# Patient Record
Sex: Female | Born: 1960 | Race: White | Hispanic: No | Marital: Married | Smoking: Never smoker
Health system: Southern US, Community
[De-identification: ages and names within clinical notes are randomized; demographics above are authoritative.]

## PROBLEM LIST (undated history)

## (undated) DIAGNOSIS — H5310 Unspecified subjective visual disturbances: Secondary | ICD-10-CM

## (undated) DIAGNOSIS — H518 Other specified disorders of binocular movement: Secondary | ICD-10-CM

## (undated) DIAGNOSIS — I10 Essential (primary) hypertension: Secondary | ICD-10-CM

## (undated) DIAGNOSIS — F32A Depression, unspecified: Secondary | ICD-10-CM

## (undated) DIAGNOSIS — Z803 Family history of malignant neoplasm of breast: Secondary | ICD-10-CM

## (undated) DIAGNOSIS — F419 Anxiety disorder, unspecified: Secondary | ICD-10-CM

## (undated) DIAGNOSIS — H5015 Alternating exotropia: Secondary | ICD-10-CM

## (undated) HISTORY — PX: SUPRACERVICAL ABDOMINAL HYSTERECTOMY: SHX5393

---

## 2003-03-08 ENCOUNTER — Ambulatory Visit (HOSPITAL_COMMUNITY): Admission: RE | Admit: 2003-03-08 | Discharge: 2003-03-08 | Payer: Self-pay | Admitting: Obstetrics and Gynecology

## 2004-10-19 ENCOUNTER — Other Ambulatory Visit: Admission: RE | Admit: 2004-10-19 | Discharge: 2004-10-19 | Payer: Self-pay | Admitting: Obstetrics and Gynecology

## 2005-12-20 ENCOUNTER — Ambulatory Visit (HOSPITAL_COMMUNITY): Admission: RE | Admit: 2005-12-20 | Discharge: 2005-12-21 | Payer: Self-pay | Admitting: Obstetrics and Gynecology

## 2012-04-11 DIAGNOSIS — H5034 Intermittent alternating exotropia: Secondary | ICD-10-CM | POA: Insufficient documentation

## 2012-11-08 ENCOUNTER — Encounter (INDEPENDENT_AMBULATORY_CARE_PROVIDER_SITE_OTHER): Payer: Self-pay

## 2012-11-08 DIAGNOSIS — I83893 Varicose veins of bilateral lower extremities with other complications: Secondary | ICD-10-CM

## 2014-09-17 DIAGNOSIS — Z9889 Other specified postprocedural states: Secondary | ICD-10-CM | POA: Insufficient documentation

## 2014-10-29 ENCOUNTER — Other Ambulatory Visit: Payer: Self-pay | Admitting: Obstetrics and Gynecology

## 2014-10-31 LAB — CYTOLOGY - PAP

## 2015-09-03 DIAGNOSIS — H5015 Alternating exotropia: Secondary | ICD-10-CM | POA: Diagnosis not present

## 2015-10-13 DIAGNOSIS — H5310 Unspecified subjective visual disturbances: Secondary | ICD-10-CM | POA: Diagnosis not present

## 2015-10-13 DIAGNOSIS — H518 Other specified disorders of binocular movement: Secondary | ICD-10-CM | POA: Diagnosis not present

## 2015-10-13 DIAGNOSIS — H5015 Alternating exotropia: Secondary | ICD-10-CM | POA: Diagnosis not present

## 2015-11-04 DIAGNOSIS — Z01419 Encounter for gynecological examination (general) (routine) without abnormal findings: Secondary | ICD-10-CM | POA: Diagnosis not present

## 2015-11-04 DIAGNOSIS — Z1231 Encounter for screening mammogram for malignant neoplasm of breast: Secondary | ICD-10-CM | POA: Diagnosis not present

## 2015-11-04 DIAGNOSIS — Z13820 Encounter for screening for osteoporosis: Secondary | ICD-10-CM | POA: Diagnosis not present

## 2015-11-04 DIAGNOSIS — Z6824 Body mass index (BMI) 24.0-24.9, adult: Secondary | ICD-10-CM | POA: Diagnosis not present

## 2015-11-17 DIAGNOSIS — H5015 Alternating exotropia: Secondary | ICD-10-CM | POA: Diagnosis not present

## 2015-11-17 DIAGNOSIS — H518 Other specified disorders of binocular movement: Secondary | ICD-10-CM | POA: Diagnosis not present

## 2015-11-17 HISTORY — PX: STRABISMUS SURGERY: SHX218

## 2016-04-13 DIAGNOSIS — J0110 Acute frontal sinusitis, unspecified: Secondary | ICD-10-CM | POA: Diagnosis not present

## 2016-05-13 DIAGNOSIS — G479 Sleep disorder, unspecified: Secondary | ICD-10-CM | POA: Diagnosis not present

## 2016-05-13 DIAGNOSIS — R030 Elevated blood-pressure reading, without diagnosis of hypertension: Secondary | ICD-10-CM | POA: Diagnosis not present

## 2016-07-19 DIAGNOSIS — R030 Elevated blood-pressure reading, without diagnosis of hypertension: Secondary | ICD-10-CM | POA: Diagnosis not present

## 2016-07-19 DIAGNOSIS — Z13220 Encounter for screening for lipoid disorders: Secondary | ICD-10-CM | POA: Diagnosis not present

## 2016-12-04 DIAGNOSIS — J0110 Acute frontal sinusitis, unspecified: Secondary | ICD-10-CM | POA: Diagnosis not present

## 2016-12-06 DIAGNOSIS — H2513 Age-related nuclear cataract, bilateral: Secondary | ICD-10-CM | POA: Diagnosis not present

## 2016-12-06 DIAGNOSIS — H50112 Monocular exotropia, left eye: Secondary | ICD-10-CM | POA: Diagnosis not present

## 2017-01-03 DIAGNOSIS — Z01419 Encounter for gynecological examination (general) (routine) without abnormal findings: Secondary | ICD-10-CM | POA: Diagnosis not present

## 2017-01-03 DIAGNOSIS — Z6824 Body mass index (BMI) 24.0-24.9, adult: Secondary | ICD-10-CM | POA: Diagnosis not present

## 2017-01-03 DIAGNOSIS — Z1231 Encounter for screening mammogram for malignant neoplasm of breast: Secondary | ICD-10-CM | POA: Diagnosis not present

## 2017-01-05 DIAGNOSIS — I10 Essential (primary) hypertension: Secondary | ICD-10-CM | POA: Diagnosis not present

## 2017-01-05 DIAGNOSIS — G479 Sleep disorder, unspecified: Secondary | ICD-10-CM | POA: Diagnosis not present

## 2017-01-05 DIAGNOSIS — Z23 Encounter for immunization: Secondary | ICD-10-CM | POA: Diagnosis not present

## 2017-07-05 DIAGNOSIS — J3489 Other specified disorders of nose and nasal sinuses: Secondary | ICD-10-CM | POA: Diagnosis not present

## 2017-07-05 DIAGNOSIS — R0989 Other specified symptoms and signs involving the circulatory and respiratory systems: Secondary | ICD-10-CM | POA: Diagnosis not present

## 2017-11-15 DIAGNOSIS — L821 Other seborrheic keratosis: Secondary | ICD-10-CM | POA: Diagnosis not present

## 2017-11-15 DIAGNOSIS — D485 Neoplasm of uncertain behavior of skin: Secondary | ICD-10-CM | POA: Diagnosis not present

## 2017-11-15 DIAGNOSIS — L719 Rosacea, unspecified: Secondary | ICD-10-CM | POA: Diagnosis not present

## 2017-11-15 DIAGNOSIS — L814 Other melanin hyperpigmentation: Secondary | ICD-10-CM | POA: Diagnosis not present

## 2017-11-15 DIAGNOSIS — L570 Actinic keratosis: Secondary | ICD-10-CM | POA: Diagnosis not present

## 2017-11-15 DIAGNOSIS — Z86018 Personal history of other benign neoplasm: Secondary | ICD-10-CM | POA: Diagnosis not present

## 2017-12-12 DIAGNOSIS — Z860101 Personal history of adenomatous and serrated colon polyps: Secondary | ICD-10-CM | POA: Insufficient documentation

## 2017-12-12 DIAGNOSIS — D128 Benign neoplasm of rectum: Secondary | ICD-10-CM | POA: Diagnosis not present

## 2017-12-12 DIAGNOSIS — Z1211 Encounter for screening for malignant neoplasm of colon: Secondary | ICD-10-CM | POA: Diagnosis not present

## 2017-12-12 DIAGNOSIS — D122 Benign neoplasm of ascending colon: Secondary | ICD-10-CM | POA: Diagnosis not present

## 2017-12-12 DIAGNOSIS — K621 Rectal polyp: Secondary | ICD-10-CM | POA: Diagnosis not present

## 2017-12-12 DIAGNOSIS — K635 Polyp of colon: Secondary | ICD-10-CM | POA: Diagnosis not present

## 2017-12-12 DIAGNOSIS — Z86010 Personal history of colonic polyps: Secondary | ICD-10-CM | POA: Diagnosis not present

## 2017-12-26 DIAGNOSIS — Z23 Encounter for immunization: Secondary | ICD-10-CM | POA: Diagnosis not present

## 2017-12-26 DIAGNOSIS — I10 Essential (primary) hypertension: Secondary | ICD-10-CM | POA: Diagnosis not present

## 2018-01-13 DIAGNOSIS — R05 Cough: Secondary | ICD-10-CM | POA: Diagnosis not present

## 2018-05-04 DIAGNOSIS — F5101 Primary insomnia: Secondary | ICD-10-CM | POA: Diagnosis not present

## 2018-05-04 DIAGNOSIS — H3589 Other specified retinal disorders: Secondary | ICD-10-CM | POA: Diagnosis not present

## 2018-08-31 DIAGNOSIS — I10 Essential (primary) hypertension: Secondary | ICD-10-CM | POA: Diagnosis not present

## 2018-09-28 DIAGNOSIS — F419 Anxiety disorder, unspecified: Secondary | ICD-10-CM | POA: Diagnosis not present

## 2018-11-30 DIAGNOSIS — I10 Essential (primary) hypertension: Secondary | ICD-10-CM | POA: Diagnosis not present

## 2018-11-30 DIAGNOSIS — F419 Anxiety disorder, unspecified: Secondary | ICD-10-CM | POA: Diagnosis not present

## 2018-11-30 DIAGNOSIS — F5101 Primary insomnia: Secondary | ICD-10-CM | POA: Diagnosis not present

## 2018-12-22 DIAGNOSIS — H2513 Age-related nuclear cataract, bilateral: Secondary | ICD-10-CM | POA: Diagnosis not present

## 2018-12-22 DIAGNOSIS — Z9889 Other specified postprocedural states: Secondary | ICD-10-CM | POA: Diagnosis not present

## 2018-12-22 DIAGNOSIS — H5010 Unspecified exotropia: Secondary | ICD-10-CM | POA: Diagnosis not present

## 2019-01-01 DIAGNOSIS — L570 Actinic keratosis: Secondary | ICD-10-CM | POA: Diagnosis not present

## 2019-01-01 DIAGNOSIS — D485 Neoplasm of uncertain behavior of skin: Secondary | ICD-10-CM | POA: Diagnosis not present

## 2019-01-01 DIAGNOSIS — L814 Other melanin hyperpigmentation: Secondary | ICD-10-CM | POA: Diagnosis not present

## 2019-01-01 DIAGNOSIS — Z86018 Personal history of other benign neoplasm: Secondary | ICD-10-CM | POA: Diagnosis not present

## 2019-01-01 DIAGNOSIS — L609 Nail disorder, unspecified: Secondary | ICD-10-CM | POA: Diagnosis not present

## 2019-01-01 DIAGNOSIS — D2272 Melanocytic nevi of left lower limb, including hip: Secondary | ICD-10-CM | POA: Diagnosis not present

## 2019-01-01 DIAGNOSIS — L719 Rosacea, unspecified: Secondary | ICD-10-CM | POA: Diagnosis not present

## 2019-01-01 DIAGNOSIS — L603 Nail dystrophy: Secondary | ICD-10-CM | POA: Diagnosis not present

## 2019-02-08 DIAGNOSIS — Z6825 Body mass index (BMI) 25.0-25.9, adult: Secondary | ICD-10-CM | POA: Diagnosis not present

## 2019-02-08 DIAGNOSIS — Z13820 Encounter for screening for osteoporosis: Secondary | ICD-10-CM | POA: Diagnosis not present

## 2019-02-08 DIAGNOSIS — Z01419 Encounter for gynecological examination (general) (routine) without abnormal findings: Secondary | ICD-10-CM | POA: Diagnosis not present

## 2019-02-08 DIAGNOSIS — Z1231 Encounter for screening mammogram for malignant neoplasm of breast: Secondary | ICD-10-CM | POA: Diagnosis not present

## 2019-02-08 DIAGNOSIS — I10 Essential (primary) hypertension: Secondary | ICD-10-CM | POA: Insufficient documentation

## 2019-08-15 DIAGNOSIS — I10 Essential (primary) hypertension: Secondary | ICD-10-CM | POA: Diagnosis not present

## 2019-08-15 DIAGNOSIS — G479 Sleep disorder, unspecified: Secondary | ICD-10-CM | POA: Diagnosis not present

## 2019-08-15 DIAGNOSIS — F419 Anxiety disorder, unspecified: Secondary | ICD-10-CM | POA: Diagnosis not present

## 2019-08-28 DIAGNOSIS — J019 Acute sinusitis, unspecified: Secondary | ICD-10-CM | POA: Diagnosis not present

## 2020-01-02 DIAGNOSIS — Z86018 Personal history of other benign neoplasm: Secondary | ICD-10-CM | POA: Diagnosis not present

## 2020-01-02 DIAGNOSIS — L578 Other skin changes due to chronic exposure to nonionizing radiation: Secondary | ICD-10-CM | POA: Diagnosis not present

## 2020-01-02 DIAGNOSIS — D225 Melanocytic nevi of trunk: Secondary | ICD-10-CM | POA: Diagnosis not present

## 2020-01-02 DIAGNOSIS — L814 Other melanin hyperpigmentation: Secondary | ICD-10-CM | POA: Diagnosis not present

## 2020-01-15 DIAGNOSIS — M9901 Segmental and somatic dysfunction of cervical region: Secondary | ICD-10-CM | POA: Diagnosis not present

## 2020-01-15 DIAGNOSIS — M50321 Other cervical disc degeneration at C4-C5 level: Secondary | ICD-10-CM | POA: Diagnosis not present

## 2020-01-15 DIAGNOSIS — M7912 Myalgia of auxiliary muscles, head and neck: Secondary | ICD-10-CM | POA: Diagnosis not present

## 2020-01-15 DIAGNOSIS — M9902 Segmental and somatic dysfunction of thoracic region: Secondary | ICD-10-CM | POA: Diagnosis not present

## 2020-01-17 DIAGNOSIS — M9901 Segmental and somatic dysfunction of cervical region: Secondary | ICD-10-CM | POA: Diagnosis not present

## 2020-01-17 DIAGNOSIS — M7912 Myalgia of auxiliary muscles, head and neck: Secondary | ICD-10-CM | POA: Diagnosis not present

## 2020-01-17 DIAGNOSIS — M9902 Segmental and somatic dysfunction of thoracic region: Secondary | ICD-10-CM | POA: Diagnosis not present

## 2020-01-17 DIAGNOSIS — M50321 Other cervical disc degeneration at C4-C5 level: Secondary | ICD-10-CM | POA: Diagnosis not present

## 2020-01-21 DIAGNOSIS — M7912 Myalgia of auxiliary muscles, head and neck: Secondary | ICD-10-CM | POA: Diagnosis not present

## 2020-01-21 DIAGNOSIS — M9901 Segmental and somatic dysfunction of cervical region: Secondary | ICD-10-CM | POA: Diagnosis not present

## 2020-01-21 DIAGNOSIS — M9902 Segmental and somatic dysfunction of thoracic region: Secondary | ICD-10-CM | POA: Diagnosis not present

## 2020-01-21 DIAGNOSIS — M50321 Other cervical disc degeneration at C4-C5 level: Secondary | ICD-10-CM | POA: Diagnosis not present

## 2020-01-23 DIAGNOSIS — M9902 Segmental and somatic dysfunction of thoracic region: Secondary | ICD-10-CM | POA: Diagnosis not present

## 2020-01-23 DIAGNOSIS — M9901 Segmental and somatic dysfunction of cervical region: Secondary | ICD-10-CM | POA: Diagnosis not present

## 2020-01-23 DIAGNOSIS — M50321 Other cervical disc degeneration at C4-C5 level: Secondary | ICD-10-CM | POA: Diagnosis not present

## 2020-01-23 DIAGNOSIS — M7912 Myalgia of auxiliary muscles, head and neck: Secondary | ICD-10-CM | POA: Diagnosis not present

## 2020-01-24 DIAGNOSIS — M9901 Segmental and somatic dysfunction of cervical region: Secondary | ICD-10-CM | POA: Diagnosis not present

## 2020-01-24 DIAGNOSIS — M7912 Myalgia of auxiliary muscles, head and neck: Secondary | ICD-10-CM | POA: Diagnosis not present

## 2020-01-24 DIAGNOSIS — M50321 Other cervical disc degeneration at C4-C5 level: Secondary | ICD-10-CM | POA: Diagnosis not present

## 2020-01-24 DIAGNOSIS — M9902 Segmental and somatic dysfunction of thoracic region: Secondary | ICD-10-CM | POA: Diagnosis not present

## 2020-04-11 ENCOUNTER — Other Ambulatory Visit: Payer: Self-pay | Admitting: Nurse Practitioner

## 2020-04-11 DIAGNOSIS — N959 Unspecified menopausal and perimenopausal disorder: Secondary | ICD-10-CM

## 2020-04-18 ENCOUNTER — Other Ambulatory Visit: Payer: Self-pay | Admitting: Nurse Practitioner

## 2020-04-18 DIAGNOSIS — Z1231 Encounter for screening mammogram for malignant neoplasm of breast: Secondary | ICD-10-CM

## 2020-05-10 ENCOUNTER — Ambulatory Visit
Admission: RE | Admit: 2020-05-10 | Discharge: 2020-05-10 | Disposition: A | Discharge status: Home / Self Care | Payer: 59 | Source: Ambulatory Visit | Attending: Nurse Practitioner | Admitting: Nurse Practitioner

## 2020-05-10 ENCOUNTER — Other Ambulatory Visit: Payer: Self-pay

## 2020-05-10 DIAGNOSIS — Z1231 Encounter for screening mammogram for malignant neoplasm of breast: Secondary | ICD-10-CM

## 2020-05-10 DIAGNOSIS — N959 Unspecified menopausal and perimenopausal disorder: Secondary | ICD-10-CM

## 2021-04-09 ENCOUNTER — Other Ambulatory Visit: Payer: Self-pay | Admitting: Family Medicine

## 2021-04-09 DIAGNOSIS — Z1231 Encounter for screening mammogram for malignant neoplasm of breast: Secondary | ICD-10-CM

## 2021-06-01 ENCOUNTER — Ambulatory Visit: Payer: 59

## 2021-07-20 ENCOUNTER — Ambulatory Visit: Payer: 59

## 2021-07-27 ENCOUNTER — Ambulatory Visit
Admission: RE | Admit: 2021-07-27 | Discharge: 2021-07-27 | Disposition: A | Discharge status: Home / Self Care | Payer: 59 | Source: Ambulatory Visit | Attending: Family Medicine | Admitting: Family Medicine

## 2021-07-27 DIAGNOSIS — Z1231 Encounter for screening mammogram for malignant neoplasm of breast: Secondary | ICD-10-CM

## 2021-07-29 ENCOUNTER — Other Ambulatory Visit: Payer: Self-pay | Admitting: Family Medicine

## 2021-07-29 DIAGNOSIS — R928 Other abnormal and inconclusive findings on diagnostic imaging of breast: Secondary | ICD-10-CM

## 2021-08-06 ENCOUNTER — Ambulatory Visit
Admission: RE | Admit: 2021-08-06 | Discharge: 2021-08-06 | Disposition: A | Discharge status: Home / Self Care | Payer: 59 | Source: Ambulatory Visit | Attending: Family Medicine | Admitting: Family Medicine

## 2021-08-06 ENCOUNTER — Other Ambulatory Visit: Payer: Self-pay | Admitting: Family Medicine

## 2021-08-06 DIAGNOSIS — R928 Other abnormal and inconclusive findings on diagnostic imaging of breast: Secondary | ICD-10-CM

## 2021-08-06 DIAGNOSIS — R921 Mammographic calcification found on diagnostic imaging of breast: Secondary | ICD-10-CM

## 2022-02-10 ENCOUNTER — Ambulatory Visit
Admission: RE | Admit: 2022-02-10 | Discharge: 2022-02-10 | Disposition: A | Discharge status: Home / Self Care | Payer: 59 | Source: Ambulatory Visit | Attending: Family Medicine | Admitting: Family Medicine

## 2022-02-10 DIAGNOSIS — R921 Mammographic calcification found on diagnostic imaging of breast: Secondary | ICD-10-CM

## 2022-02-16 ENCOUNTER — Other Ambulatory Visit: Payer: Self-pay | Admitting: Family Medicine

## 2022-02-16 DIAGNOSIS — R921 Mammographic calcification found on diagnostic imaging of breast: Secondary | ICD-10-CM

## 2022-08-24 ENCOUNTER — Ambulatory Visit
Admission: RE | Admit: 2022-08-24 | Discharge: 2022-08-24 | Disposition: A | Discharge status: Home / Self Care | Payer: 59 | Source: Ambulatory Visit | Attending: Family Medicine | Admitting: Family Medicine

## 2022-08-24 DIAGNOSIS — R921 Mammographic calcification found on diagnostic imaging of breast: Secondary | ICD-10-CM

## 2022-08-25 ENCOUNTER — Other Ambulatory Visit: Payer: Self-pay | Admitting: Family Medicine

## 2022-08-25 DIAGNOSIS — R921 Mammographic calcification found on diagnostic imaging of breast: Secondary | ICD-10-CM

## 2022-08-27 ENCOUNTER — Ambulatory Visit
Admission: RE | Admit: 2022-08-27 | Discharge: 2022-08-27 | Disposition: A | Discharge status: Home / Self Care | Payer: 59 | Source: Ambulatory Visit | Attending: Family Medicine | Admitting: Family Medicine

## 2022-08-27 DIAGNOSIS — C50919 Malignant neoplasm of unspecified site of unspecified female breast: Secondary | ICD-10-CM

## 2022-08-27 DIAGNOSIS — R921 Mammographic calcification found on diagnostic imaging of breast: Secondary | ICD-10-CM

## 2022-08-27 HISTORY — PX: BREAST BIOPSY: SHX20

## 2022-08-27 HISTORY — DX: Malignant neoplasm of unspecified site of unspecified female breast: C50.919

## 2022-09-06 ENCOUNTER — Other Ambulatory Visit: Payer: Self-pay | Admitting: General Surgery

## 2022-09-06 DIAGNOSIS — Z170 Estrogen receptor positive status [ER+]: Secondary | ICD-10-CM

## 2022-09-06 DIAGNOSIS — Z803 Family history of malignant neoplasm of breast: Secondary | ICD-10-CM | POA: Insufficient documentation

## 2022-09-07 ENCOUNTER — Other Ambulatory Visit: Payer: Self-pay | Admitting: General Surgery

## 2022-09-07 DIAGNOSIS — Z170 Estrogen receptor positive status [ER+]: Secondary | ICD-10-CM

## 2022-09-08 ENCOUNTER — Encounter: Payer: Self-pay | Admitting: Radiation Oncology

## 2022-09-08 DIAGNOSIS — D0511 Intraductal carcinoma in situ of right breast: Secondary | ICD-10-CM | POA: Insufficient documentation

## 2022-09-08 NOTE — Progress Notes (Signed)
Radiation Oncology         ([phone]) [phone] ________________________________  Name: April Maynard        MRN: [id number]  Date of Service: 09/09/2022 DOB: 12-10-1960  QM:VHQION, Jeannett Senior, MD  Almond Lint, MD     REFERRING PHYSICIAN: Almond Lint, MD   DIAGNOSIS: The encounter diagnosis was Ductal carcinoma in situ (DCIS) of right breast.   HISTORY OF PRESENT ILLNESS: April Maynard is a 62 y.o. female seen at the request of Dr. Donell Beers for newly diagnosed right breast cancer.  The patient has been following with diagnostic mammography due to right breast calcifications seen on prior screening.  These were noted to measure a total of 2.1 cm in the anterior inferior right breast.  She underwent stereotactic biopsy on 08/27/2022 which showed intermediate to high-grade DCIS with calcifications and necrosis that were ER/PR positive.  She has met with Dr. Donell Beers and is scheduled to undergo a right lumpectomy on 09/30/2022.  She is seen to discuss adjuvant therapy.    PREVIOUS RADIATION THERAPY: No   PAST MEDICAL HISTORY:  Past Medical History:  Diagnosis Date   Alternating exotropia    Bilateral eye strain    Breast cancer (HCC) 08/27/2022   Dissociated vertical deviation    Family history of breast cancer        PAST SURGICAL HISTORY: Past Surgical History:  Procedure Laterality Date   BREAST BIOPSY Right 08/27/2022   MM RT BREAST BX W LOC DEV 1ST LESION IMAGE BX SPEC STEREO GUIDE 08/27/2022 [facility]   STRABISMUS SURGERY Bilateral 11/17/2015   SUPRACERVICAL ABDOMINAL HYSTERECTOMY       FAMILY HISTORY:  Family History  Problem Relation Age of Onset   Breast cancer Sister        30s   BRCA 1/2 Neg Hx      SOCIAL HISTORY:  reports that she has never smoked. She has never used smokeless tobacco. She reports current alcohol use. She reports that she does not use drugs.   ALLERGIES: Morphine   MEDICATIONS:  Current Outpatient Medications  Medication Sig Dispense  Refill   ALPRAZolam (XANAX) 0.25 MG tablet Take 0.25 mg by mouth 2 (two) times daily as needed.     ascorbic acid (VITAMIN C) 1000 MG tablet Take by mouth.     Cholecalciferol (VITAMIN D-1000 MAX ST) 25 MCG (1000 UT) tablet Take by mouth.     clonazePAM (KLONOPIN) 0.5 MG tablet TAKE 1 TABLET AT BEDTIME TWICE A DAY AS NEEDED ORALLY 10 DAYS     escitalopram (LEXAPRO) 20 MG tablet Take 20 mg by mouth daily.     fluticasone (FLONASE) 50 MCG/ACT nasal spray Use 2 squirts to each nostril 2 times daily for 7-10 days, then 2 squirts in each nare daily     lisinopril (ZESTRIL) 10 MG tablet Take 1 tablet by mouth daily.     losartan (COZAAR) 25 MG tablet Take by mouth.     Multiple Vitamin (MULTIVITAMIN) capsule Take 1 capsule by mouth daily.     omeprazole (PRILOSEC) 10 MG capsule Take by mouth.     ondansetron (ZOFRAN) 4 MG tablet Take by mouth.     prednisoLONE acetate (PRED FORTE) 1 % ophthalmic suspension Apply to eye.     tobramycin-dexamethasone (TOBRADEX) ophthalmic solution Apply to eye.     traZODone (DESYREL) 50 MG tablet Take 50-100 mg by mouth at bedtime as needed.     Zinc 10 MG LOZG Zinc     Zinc  Citrate-Phytase (ZYTAZE) 25-500 MG CAPS Take by mouth.     zolpidem (AMBIEN) 10 MG tablet Take 0.5-1 tablets by mouth at bedtime as needed.     No current facility-administered medications for this encounter.     REVIEW OF SYSTEMS: On review of systems, the patient reports that she is doing well overall. She has been ready to proceed with her treatment. No breast complaints are verbalized.      PHYSICAL EXAM:  Wt Readings from Last 3 Encounters:  09/09/22 161 lb 8 oz (73.3 kg)   Temp Readings from Last 3 Encounters:  09/09/22 (!) 97.5 F (36.4 C) (Temporal)   BP Readings from Last 3 Encounters:  09/09/22 126/77   Pulse Readings from Last 3 Encounters:  09/09/22 77    In general this is a well appearing caucasian female in no acute distress. She's alert and oriented x4 and  appropriate throughout the examination. Cardiopulmonary assessment is negative for acute distress and she exhibits normal effort. Bilateral breast exam is deferred.    ECOG = 0  0 - Asymptomatic (Fully active, able to carry on all predisease activities without restriction)  1 - Symptomatic but completely ambulatory (Restricted in physically strenuous activity but ambulatory and able to carry out work of a light or sedentary nature. For example, light housework, office work)  2 - Symptomatic, <50% in bed during the day (Ambulatory and capable of all self care but unable to carry out any work activities. Up and about more than 50% of waking hours)  3 - Symptomatic, >50% in bed, but not bedbound (Capable of only limited self-care, confined to bed or chair 50% or more of waking hours)  4 - Bedbound (Completely disabled. Cannot carry on any self-care. Totally confined to bed or chair)  5 - Death   Santiago Glad MM, Creech RH, Tormey DC, et al. [phone]). "Toxicity and response criteria of the [facility]". Am. Evlyn Clines. Oncol. 5 (6): 649-55    LABORATORY DATA:  No results found for: "WBC", "HGB", "HCT", "MCV", "PLT" No results found for: "NA", "K", "CL", "CO2" No results found for: "ALT", "AST", "GGT", "ALKPHOS", "BILITOT"    RADIOGRAPHY: MM RT BREAST BX W LOC DEV 1ST LESION IMAGE BX SPEC STEREO GUIDE  Addendum Date: 09/01/2022   ADDENDUM REPORT: 09/01/2022 07:24 ADDENDUM: Pathology revealed: DUCTAL CARCINOMA IN SITU, SOLID TYPE, INTERMEDIATE TO HIGH NUCLEAR GRADE, NECROSIS: PRESENT, CALCIFICATIONS: PRESENT, of the RIGHT breast, upper middle to posterior depth 12 o'clock location, (X clip). This was found to be concordant by Dr. Quincy Carnes. Pathology results were discussed with the patient by telephone. The patient reported doing well after the biopsy with tenderness at the site. Post biopsy instructions and care were reviewed and questions were answered. The patient was  encouraged to call [facility] for any additional concerns. My direct phone number was provided. Surgical consultation has been arranged with Dr. Almond Lint at [facility] on September 06, 2022. Pathology results reported by Rene Kocher, RN on 08/30/2022. Electronically Signed   By: Hulan Saas M.D.   On: 09/01/2022 07:24   Result Date: 09/01/2022 CLINICAL DATA:  62 year old with an indeterminate 1.5 cm group of calcifications involving the upper RIGHT breast at posterior depth, 12 o'clock location. EXAM: RIGHT BREAST STEREOTACTIC CORE NEEDLE BIOPSY 2D AND 3D DIAGNOSTIC RIGHT MAMMOGRAM POST BIOPSY COMPARISON:  Previous exam(s). FINDINGS: The patient and I discussed the procedure of stereotactic-guided biopsy including benefits and alternatives. We discussed the  high likelihood of a successful procedure. We discussed the risks of the procedure including infection, bleeding, tissue injury, clip migration, and inadequate sampling. Informed written consent was given. The usual time out protocol was performed immediately prior to the procedure. Lesion quadrant: Upper breast, 12 o'clock location. Using sterile technique with chlorhexidine as skin antisepsis, 1% lidocaine and 1% lidocaine with epinephrine as local anesthetic, under stereotactic tomosynthesis guidance, a 9 gauge Brevera vacuum assisted device was used to perform core needle biopsy of the calcifications in the upper breast using a superior approach. Specimen radiograph was performed showing calcifications in 3 of the 4 core samples. Specimens with calcifications are identified for pathology. At the conclusion of the procedure, an X shaped tissue marker clip was deployed into the biopsy cavity. The patient tolerated the procedure well without apparent immediate complications. Follow-up 2D and 3D full field CC and mediolateral mammographic images were obtained to confirm clip placement. The X shaped tissue marking  clip is appropriately positioned at the site of the biopsied calcifications in the upper breast at posterior depth. There are scattered residual calcifications at the biopsy site. Expected post biopsy changes are present without evidence of hematoma. IMPRESSION: 1. Stereotactic tomosynthesis guided needle biopsy of indeterminate calcifications involving the upper RIGHT breast at posterior depth. 2. Appropriate positioning of the X shaped tissue marking clip at the site of the biopsied calcifications in the upper breast. Electronically Signed: By: Hulan Saas M.D. On: 08/27/2022 11:18   MM CLIP PLACEMENT RIGHT  Addendum Date: 09/01/2022   ADDENDUM REPORT: 09/01/2022 07:24 ADDENDUM: Pathology revealed: DUCTAL CARCINOMA IN SITU, SOLID TYPE, INTERMEDIATE TO HIGH NUCLEAR GRADE, NECROSIS: PRESENT, CALCIFICATIONS: PRESENT, of the RIGHT breast, upper middle to posterior depth 12 o'clock location, (X clip). This was found to be concordant by Dr. Quincy Carnes. Pathology results were discussed with the patient by telephone. The patient reported doing well after the biopsy with tenderness at the site. Post biopsy instructions and care were reviewed and questions were answered. The patient was encouraged to call [facility] for any additional concerns. My direct phone number was provided. Surgical consultation has been arranged with Dr. Almond Lint at [facility] on September 06, 2022. Pathology results reported by Rene Kocher, RN on 08/30/2022. Electronically Signed   By: Hulan Saas M.D.   On: 09/01/2022 07:24   Result Date: 09/01/2022 CLINICAL DATA:  62 year old with an indeterminate 1.5 cm group of calcifications involving the upper RIGHT breast at posterior depth, 12 o'clock location. EXAM: RIGHT BREAST STEREOTACTIC CORE NEEDLE BIOPSY 2D AND 3D DIAGNOSTIC RIGHT MAMMOGRAM POST BIOPSY COMPARISON:  Previous exam(s). FINDINGS: The patient and I discussed the procedure of  stereotactic-guided biopsy including benefits and alternatives. We discussed the high likelihood of a successful procedure. We discussed the risks of the procedure including infection, bleeding, tissue injury, clip migration, and inadequate sampling. Informed written consent was given. The usual time out protocol was performed immediately prior to the procedure. Lesion quadrant: Upper breast, 12 o'clock location. Using sterile technique with chlorhexidine as skin antisepsis, 1% lidocaine and 1% lidocaine with epinephrine as local anesthetic, under stereotactic tomosynthesis guidance, a 9 gauge Brevera vacuum assisted device was used to perform core needle biopsy of the calcifications in the upper breast using a superior approach. Specimen radiograph was performed showing calcifications in 3 of the 4 core samples. Specimens with calcifications are identified for pathology. At the conclusion of the procedure, an X shaped tissue marker clip was deployed into the  biopsy cavity. The patient tolerated the procedure well without apparent immediate complications. Follow-up 2D and 3D full field CC and mediolateral mammographic images were obtained to confirm clip placement. The X shaped tissue marking clip is appropriately positioned at the site of the biopsied calcifications in the upper breast at posterior depth. There are scattered residual calcifications at the biopsy site. Expected post biopsy changes are present without evidence of hematoma. IMPRESSION: 1. Stereotactic tomosynthesis guided needle biopsy of indeterminate calcifications involving the upper RIGHT breast at posterior depth. 2. Appropriate positioning of the X shaped tissue marking clip at the site of the biopsied calcifications in the upper breast. Electronically Signed: By: Hulan Saas M.D. On: 08/27/2022 11:18   MM DIAG BREAST TOMO BILATERAL  Result Date: 08/24/2022 CLINICAL DATA:  Patient for short-term follow-up of right breast calcifications.  EXAM: DIGITAL DIAGNOSTIC BILATERAL MAMMOGRAM WITH TOMOSYNTHESIS AND CAD TECHNIQUE: Bilateral digital diagnostic mammography and breast tomosynthesis was performed. The images were evaluated with computer-aided detection. COMPARISON:  Previous exam(s). ACR Breast Density Category c: The breasts are heterogeneously dense, which may obscure small masses. FINDINGS: Magnification views of the right breast were obtained. Increasing round and punctate calcifications are demonstrated within the superior posterior right breast. In total the group of calcifications measures 2.1 cm. The increasing component is located along the anterior inferior margin of the group of calcifications. Normal left breast. IMPRESSION: Increasing indeterminate calcifications right breast. RECOMMENDATION: Stereotactic guided core needle biopsy of the anterior inferior aspect of the increasing group of right breast calcifications. If these are surgical or malignant in etiology, stereo of the additional group more superiorly can be obtained or these can be localized at the time of surgery. I have discussed the findings and recommendations with the patient. If applicable, a reminder letter will be sent to the patient regarding the next appointment. BI-RADS CATEGORY  4: Suspicious. Electronically Signed   By: Annia Belt M.D.   On: 08/24/2022 11:59       IMPRESSION/PLAN: 1. Intermediate to High Grade ER/PR positive DCIS of the right breast. Dr. Mitzi Hansen discusses the pathology findings and reviews the nature of early stage breast disease. Dr. Donell Beers is planning breast conservation with lumpectomy which is scheduled later this month. Dr. Mitzi Hansen recommends external radiotherapy to the breast  to reduce risks of local recurrence followed by antiestrogen therapy. We discussed the risks, benefits, short, and long term effects of radiotherapy, as well as the curative intent, and the patient is interested in proceeding. Dr. Mitzi Hansen discusses the delivery and  logistics of radiotherapy and anticipates a course of 4 weeks of radiotherapy to the right breast. We will see her back a few weeks after surgery to discuss the simulation process and anticipate we starting radiotherapy about 4-6 weeks after surgery.     In a visit lasting 60 minutes, greater than 50% of the time was spent face to face reviewing her case, as well as in preparation of, discussing, and coordinating the patient's care.  The above documentation reflects my direct findings during this shared patient visit. Please see the separate note by Dr. Mitzi Hansen on this date for the remainder of the patient's plan of care.    Osker Mason, [facility]    **Disclaimer: This note was dictated with voice recognition software. Similar sounding words can inadvertently be transcribed and this note may contain transcription errors which may not have been corrected upon publication of note.**

## 2022-09-08 NOTE — Progress Notes (Signed)
New Breast Cancer Diagnosis: Right Breast  Did patient present with symptoms (if so, please note symptoms) or screening mammography?:Screening Mass    Location and Extent of disease :right breast. Located at 12 o'clock position, measured 0.6 cm in greatest dimension. Adenopathy no.  Histology per Pathology Report: intermediate to high nuclear grade, DCIS with necrosis and calcifications 08/27/2022  Receptor Status: ER(positive), PR (positive), Her2-neu (), Ki-(%)   Surgeon and surgical plan, if any:  Dr. Donell Beers -Right Breast Lumpectomy with radioactive seed localization 09/30/2022   Medical oncologist, treatment if any:     Family History of Breast/Ovarian/Prostate Cancer: Sister had breast cancer.  Lymphedema issues, if any: No     Pain issues, if any: She has a little tenderness in her breast since her biopsy.    SAFETY ISSUES: Prior radiation? No Pacemaker/ICD? No Possible current pregnancy? Postmenopausal Is the patient on methotrexate? No  Current Complaints / other details:

## 2022-09-09 ENCOUNTER — Encounter: Payer: Self-pay | Admitting: Radiation Oncology

## 2022-09-09 ENCOUNTER — Ambulatory Visit
Admission: RE | Admit: 2022-09-09 | Discharge: 2022-09-09 | Disposition: A | Discharge status: Home / Self Care | Payer: 59 | Source: Ambulatory Visit | Attending: Radiation Oncology | Admitting: Radiation Oncology

## 2022-09-09 ENCOUNTER — Other Ambulatory Visit: Payer: Self-pay

## 2022-09-09 VITALS — BP 126/77 | HR 77 | Temp 97.5°F | Resp 18 | Ht 68.0 in | Wt 161.5 lb

## 2022-09-09 DIAGNOSIS — Z803 Family history of malignant neoplasm of breast: Secondary | ICD-10-CM | POA: Diagnosis not present

## 2022-09-09 DIAGNOSIS — Z170 Estrogen receptor positive status [ER+]: Secondary | ICD-10-CM | POA: Diagnosis not present

## 2022-09-09 DIAGNOSIS — D0511 Intraductal carcinoma in situ of right breast: Secondary | ICD-10-CM | POA: Insufficient documentation

## 2022-09-09 DIAGNOSIS — Z79899 Other long term (current) drug therapy: Secondary | ICD-10-CM | POA: Insufficient documentation

## 2022-09-09 HISTORY — DX: Unspecified subjective visual disturbances: H53.10

## 2022-09-09 HISTORY — DX: Family history of malignant neoplasm of breast: Z80.3

## 2022-09-09 HISTORY — DX: Other specified disorders of binocular movement: H51.8

## 2022-09-09 HISTORY — DX: Alternating exotropia: H50.15

## 2022-09-23 ENCOUNTER — Encounter (HOSPITAL_BASED_OUTPATIENT_CLINIC_OR_DEPARTMENT_OTHER): Payer: Self-pay | Admitting: General Surgery

## 2022-09-28 ENCOUNTER — Inpatient Hospital Stay (HOSPITAL_BASED_OUTPATIENT_CLINIC_OR_DEPARTMENT_OTHER): Payer: 59 | Admitting: Genetic Counselor

## 2022-09-28 ENCOUNTER — Encounter (HOSPITAL_BASED_OUTPATIENT_CLINIC_OR_DEPARTMENT_OTHER)
Admission: RE | Admit: 2022-09-28 | Discharge: 2022-09-28 | Disposition: A | Discharge status: Home / Self Care | Payer: 59 | Source: Ambulatory Visit | Attending: General Surgery | Admitting: General Surgery

## 2022-09-28 ENCOUNTER — Other Ambulatory Visit: Payer: Self-pay

## 2022-09-28 ENCOUNTER — Inpatient Hospital Stay: Payer: 59

## 2022-09-28 ENCOUNTER — Encounter: Payer: Self-pay | Admitting: *Deleted

## 2022-09-28 ENCOUNTER — Encounter: Payer: Self-pay | Admitting: Hematology and Oncology

## 2022-09-28 ENCOUNTER — Inpatient Hospital Stay: Payer: 59 | Attending: Hematology and Oncology | Admitting: Hematology and Oncology

## 2022-09-28 ENCOUNTER — Other Ambulatory Visit: Payer: Self-pay | Admitting: Genetic Counselor

## 2022-09-28 VITALS — BP 149/85 | HR 68 | Temp 97.5°F | Resp 16 | Wt 160.4 lb

## 2022-09-28 DIAGNOSIS — D0511 Intraductal carcinoma in situ of right breast: Secondary | ICD-10-CM

## 2022-09-28 DIAGNOSIS — I10 Essential (primary) hypertension: Secondary | ICD-10-CM | POA: Insufficient documentation

## 2022-09-28 DIAGNOSIS — F32A Depression, unspecified: Secondary | ICD-10-CM | POA: Diagnosis not present

## 2022-09-28 DIAGNOSIS — Z90710 Acquired absence of both cervix and uterus: Secondary | ICD-10-CM | POA: Insufficient documentation

## 2022-09-28 DIAGNOSIS — Z780 Asymptomatic menopausal state: Secondary | ICD-10-CM | POA: Insufficient documentation

## 2022-09-28 DIAGNOSIS — Z803 Family history of malignant neoplasm of breast: Secondary | ICD-10-CM | POA: Insufficient documentation

## 2022-09-28 DIAGNOSIS — Z170 Estrogen receptor positive status [ER+]: Secondary | ICD-10-CM | POA: Insufficient documentation

## 2022-09-28 DIAGNOSIS — Z79899 Other long term (current) drug therapy: Secondary | ICD-10-CM | POA: Insufficient documentation

## 2022-09-28 DIAGNOSIS — Z1379 Encounter for other screening for genetic and chromosomal anomalies: Secondary | ICD-10-CM

## 2022-09-28 DIAGNOSIS — Z885 Allergy status to narcotic agent status: Secondary | ICD-10-CM | POA: Diagnosis not present

## 2022-09-28 DIAGNOSIS — F419 Anxiety disorder, unspecified: Secondary | ICD-10-CM | POA: Insufficient documentation

## 2022-09-28 LAB — CBC WITH DIFFERENTIAL/PLATELET
Abs Immature Granulocytes: 0.01 10*3/uL (ref 0.00–0.07)
Basophils Absolute: 0 10*3/uL (ref 0.0–0.1)
Basophils Relative: 0 %
Eosinophils Absolute: 0.1 10*3/uL (ref 0.0–0.5)
Eosinophils Relative: 2 %
HCT: 42.3 % (ref 36.0–46.0)
Hemoglobin: 13.9 g/dL (ref 12.0–15.0)
Immature Granulocytes: 0 %
Lymphocytes Relative: 34 %
Lymphs Abs: 2.4 10*3/uL (ref 0.7–4.0)
MCH: 29.4 pg (ref 26.0–34.0)
MCHC: 32.9 g/dL (ref 30.0–36.0)
MCV: 89.6 fL (ref 80.0–100.0)
Monocytes Absolute: 0.4 10*3/uL (ref 0.1–1.0)
Monocytes Relative: 6 %
Neutro Abs: 4.1 10*3/uL (ref 1.7–7.7)
Neutrophils Relative %: 58 %
Platelets: 248 10*3/uL (ref 150–400)
RBC: 4.72 MIL/uL (ref 3.87–5.11)
RDW: 12.9 % (ref 11.5–15.5)
WBC: 7.1 10*3/uL (ref 4.0–10.5)
nRBC: 0 % (ref 0.0–0.2)

## 2022-09-28 LAB — CMP (CANCER CENTER ONLY)
ALT: 12 U/L (ref 0–44)
AST: 22 U/L (ref 15–41)
Albumin: 4.2 g/dL (ref 3.5–5.0)
Alkaline Phosphatase: 55 U/L (ref 38–126)
Anion gap: 7 (ref 5–15)
BUN: 21 mg/dL (ref 8–23)
CO2: 24 mmol/L (ref 22–32)
Calcium: 9.4 mg/dL (ref 8.9–10.3)
Chloride: 106 mmol/L (ref 98–111)
Creatinine: 0.93 mg/dL (ref 0.44–1.00)
GFR, Estimated: >60 mL/min (ref >60)
Glucose, Bld: 94 mg/dL (ref 70–99)
Potassium: 4.1 mmol/L (ref 3.5–5.1)
Sodium: 137 mmol/L (ref 135–145)
Total Bilirubin: 0.5 mg/dL (ref 0.3–1.2)
Total Protein: 6.9 g/dL (ref 6.5–8.1)

## 2022-09-28 LAB — GENETIC SCREENING ORDER

## 2022-09-28 MED ORDER — CHLORHEXIDINE GLUCONATE CLOTH 2 % EX PADS: 6.0000 | MEDICATED_PAD | Freq: Once | CUTANEOUS | Status: DC

## 2022-09-28 NOTE — Progress Notes (Signed)
[facility] CONSULT NOTE  Patient Care Team: Joycelyn Rua, MD as PCP - General (Family Medicine)  CHIEF COMPLAINTS/PURPOSE OF CONSULTATION:  DCIS  ASSESSMENT & PLAN:  Ductal carcinoma in situ (DCIS) of right breast This is a very pleasant 62 year old postmenopausal female patient with newly diagnosed right breast DCIS, ER/PR positive referred to medical oncology for recommendations.  We have discussed the following details about DCIS today  Pathology review: I discussed with the patient the difference between DCIS and invasive breast cancer. It is considered a precancerous lesion. DCIS is classified as a Stage 0 breast cancer. It is generally detected through mammograms as calcifications. We discussed the significance of grades and its impact on prognosis. We also discussed the importance of ER and PR receptors and their implications to adjuvant treatment options. Prognosis of DCIS dependence on grade and degree of comedo necrosis. It is anticipated that if not treated, 20-30% of DCIS can develop into invasive breast cancer.  Recommendation: 1. Breast conserving surgery 2. Followed by adjuvant radiation therapy 3. Followed by antiestrogen therapy with tamoxifen/aromatase inhibitors based on menopausal status 5 years  Tamoxifen counseling: We discussed the risks and benefits of tamoxifen. These include but not limited to insomnia, hot flashes, mood changes, vaginal dryness, and weight gain. Although rare, serious side effects including endometrial cancer, risk of blood clots were also discussed. We strongly believe that the benefits far outweigh the risks. Patient understands these risks and consented to starting treatment. Planned treatment duration is 5 years.  Aromatase inhibitors counseling: We have discussed the mechanism of action of aromatase inhibitors today.  We have discussed adverse effects including but not limited to menopausal symptoms, increased risk of  osteoporosis and fractures, cardiovascular events, arthralgias and myalgias.  We do believe that the benefits far outweigh the risks.  Plan treatment duration of 5 years.   At this time she is leaning towards tamoxifen.  I have given her printed information about tamoxifen as well as aromatase inhibitors.  She will return to clinic after radiation to initiate antiestrogen therapy.  Orders Placed This Encounter  Procedures   CBC with Differential/Platelet    Standing Status:   Standing    Number of Occurrences:   22    Standing Expiration Date:   09/28/2023   CMP (Cancer Center only)    Standing Status:   Future    Number of Occurrences:   1    Standing Expiration Date:   09/28/2023     HISTORY OF PRESENTING ILLNESS:  April Maynard 62 y.o. female is here because of DCIS right breast.  Oncology History  Ductal carcinoma in situ (DCIS) of right breast  02/10/2022 Mammogram   Stable probably benign right breast calcs noted in Jan 2024. 6 month follow up mammogram showed increasing indeterminate calcs right breast.   08/27/2022 Pathology Results   Right breast needle core biopsy showed DCIS, solid type, intermediate to high nuclear grade, necrosis present, calcifications present, prognostic showed ER 100% positive strong staining PR 2% positive strong staining   09/08/2022 Initial Diagnosis   Ductal carcinoma in situ (DCIS) of right breast   09/28/2022 Cancer Staging   Staging form: Breast, AJCC 8th Edition - Clinical: Stage 0 (cTis (DCIS), cN0, cM0, G2, ER+, PR+) - Signed by Rachel Moulds, MD on 09/28/2022 Stage prefix: Initial diagnosis Histologic grading system: 3 grade system    She arrived to the initial appointment today with her husband.  She denies any new health issues.  She has  met with the surgeon as well as the radiation oncologist.   She is understandably very anxious about discussion.  She tells me that her most notable postmenopausal symptom was depression for which she is  now on Lexapro, she also has some underlying anxiety and takes Xanax as needed  REVIEW OF SYSTEMS:   Constitutional: Denies fevers, chills or abnormal night sweats Eyes: Denies blurriness of vision, double vision or watery eyes Ears, nose, mouth, throat, and face: Denies mucositis or sore throat Respiratory: Denies cough, dyspnea or wheezes Cardiovascular: Denies palpitation, chest discomfort or lower extremity swelling Gastrointestinal:  Denies nausea, heartburn or change in bowel habits Skin: Denies abnormal skin rashes Lymphatics: Denies new lymphadenopathy or easy bruising Neurological:Denies numbness, tingling or new weaknesses Behavioral/Psych: Mood is stable, no new changes  All other systems were reviewed with the patient and are negative.  Age at first child birth: 22 Age at first cycle: 12/13 No use of HRT OCP use for 4 yrs FH of breast cancer, sister had BC at 42. History of breast feeding:  No Alcohol : occasions. Retired Runner, broadcasting/film/video,   MEDICAL HISTORY:  Past Medical History:  Diagnosis Date   Alternating exotropia    Anxiety    Bilateral eye strain    Breast cancer (HCC) 08/27/2022   Depression    Dissociated vertical deviation    Family history of breast cancer    Hypertension     SURGICAL HISTORY: Past Surgical History:  Procedure Laterality Date   BREAST BIOPSY Right 08/27/2022   MM RT BREAST BX W LOC DEV 1ST LESION IMAGE BX SPEC STEREO GUIDE 08/27/2022 [facility] MAMMOGRAP[facility]   STRABISMUS SURGERY Bilateral 11/17/2015   SUPRACERVICAL ABDOMINAL HYSTERECTOMY      SOCIAL HISTORY: Social History   Socioeconomic History   Marital status: Married    Spouse name: Not on file   Number of children: Not on file   Years of education: Not on file   Highest education level: Not on file  Occupational History   Not on file  Tobacco Use   Smoking status: Never   Smokeless tobacco: Never  Vaping Use   Vaping status: Never Used  Substance and Sexual Activity    Alcohol use: Yes    Comment: occ   Drug use: Never   Sexual activity: Not on file  Other Topics Concern   Not on file  Social History Narrative   Not on file   Social Determinants of Health   Financial Resource Strain: Not on file  Food Insecurity: No Food Insecurity (09/09/2022)   Hunger Vital Sign    Worried About Running Out of Food in the Last Year: Never true    Ran Out of Food in the Last Year: Never true  Transportation Needs: No Transportation Needs (09/09/2022)   PRAPARE - Administrator, Civil Service (Medical): No    Lack of Transportation (Non-Medical): No  Physical Activity: Not on file  Stress: Not on file  Social Connections: Not on file  Intimate Partner Violence: Not At Risk (09/09/2022)   Humiliation, Afraid, Rape, and Kick questionnaire    Fear of Current or Ex-Partner: No    Emotionally Abused: No    Physically Abused: No    Sexually Abused: No    FAMILY HISTORY: Family History  Problem Relation Age of Onset   Breast cancer Sister        30s   BRCA 1/2 Neg Hx     ALLERGIES:  is allergic to morphine.  MEDICATIONS:  Current Outpatient Medications  Medication Sig Dispense Refill   ALPRAZolam (XANAX) 0.25 MG tablet Take 0.25 mg by mouth 2 (two) times daily as needed.     ascorbic acid (VITAMIN C) 1000 MG tablet Take by mouth.     Cholecalciferol (VITAMIN D-1000 MAX ST) 25 MCG (1000 UT) tablet Take by mouth.     escitalopram (LEXAPRO) 20 MG tablet Take 20 mg by mouth daily.     fluticasone (FLONASE) 50 MCG/ACT nasal spray Use 2 squirts to each nostril 2 times daily for 7-10 days, then 2 squirts in each nare daily     losartan (COZAAR) 25 MG tablet Take by mouth.     traZODone (DESYREL) 50 MG tablet Take 50-100 mg by mouth at bedtime as needed.     Zinc 10 MG LOZG Zinc     No current facility-administered medications for this visit.   Facility-Administered Medications Ordered in Other Visits  Medication Dose Route Frequency Provider Last Rate  Last Admin   Chlorhexidine Gluconate Cloth 2 % PADS 6 each  6 each Topical Once Almond Lint, MD       And   Chlorhexidine Gluconate Cloth 2 % PADS 6 each  6 each Topical Once Almond Lint, MD         PHYSICAL EXAMINATION: ECOG PERFORMANCE STATUS: 0 - Asymptomatic  Vitals:   09/28/22 1054 09/28/22 1055  BP: (!) 153/81 (!) 149/85  Pulse: 68   Resp: 16   Temp: (!) 97.5 F (36.4 C)   SpO2: 99%    Filed Weights   09/28/22 1054  Weight: 160 lb 6.4 oz (72.8 kg)    GENERAL:alert, no distress and comfortable Rest of the physical exam deferred in lieu of counseling  LABORATORY DATA:  I have reviewed the data as listed Lab Results  Component Value Date   WBC 7.1 09/28/2022   HGB 13.9 09/28/2022   HCT 42.3 09/28/2022   MCV 89.6 09/28/2022   PLT 248 09/28/2022     Chemistry      Component Value Date/Time   NA 137 09/28/2022 1153   K 4.1 09/28/2022 1153   CL 106 09/28/2022 1153   CO2 24 09/28/2022 1153   BUN 21 09/28/2022 1153   CREATININE 0.93 09/28/2022 1153      Component Value Date/Time   CALCIUM 9.4 09/28/2022 1153   ALKPHOS 55 09/28/2022 1153   AST 22 09/28/2022 1153   ALT 12 09/28/2022 1153   BILITOT 0.5 09/28/2022 1153       RADIOGRAPHIC STUDIES: I have personally reviewed the radiological images as listed and agreed with the findings in the report. No results found.  All questions were answered. The patient knows to call the clinic with any problems, questions or concerns. I spent 45 minutes in the care of this patient including H and P, review of records, counseling and coordination of care.     Rachel Moulds, MD 09/28/2022 2:49 PM

## 2022-09-28 NOTE — Progress Notes (Signed)

## 2022-09-28 NOTE — Assessment & Plan Note (Signed)
This is a very pleasant 62 year old postmenopausal female patient with newly diagnosed right breast DCIS, ER/PR positive referred to medical oncology for recommendations.  We have discussed the following details about DCIS today  Pathology review: I discussed with the patient the difference between DCIS and invasive breast cancer. It is considered a precancerous lesion. DCIS is classified as a Stage 0 breast cancer. It is generally detected through mammograms as calcifications. We discussed the significance of grades and its impact on prognosis. We also discussed the importance of ER and PR receptors and their implications to adjuvant treatment options. Prognosis of DCIS dependence on grade and degree of comedo necrosis. It is anticipated that if not treated, 20-30% of DCIS can develop into invasive breast cancer.  Recommendation: 1. Breast conserving surgery 2. Followed by adjuvant radiation therapy 3. Followed by antiestrogen therapy with tamoxifen/aromatase inhibitors based on menopausal status 5 years  Tamoxifen counseling: We discussed the risks and benefits of tamoxifen. These include but not limited to insomnia, hot flashes, mood changes, vaginal dryness, and weight gain. Although rare, serious side effects including endometrial cancer, risk of blood clots were also discussed. We strongly believe that the benefits far outweigh the risks. Patient understands these risks and consented to starting treatment. Planned treatment duration is 5 years.  Aromatase inhibitors counseling: We have discussed the mechanism of action of aromatase inhibitors today.  We have discussed adverse effects including but not limited to menopausal symptoms, increased risk of osteoporosis and fractures, cardiovascular events, arthralgias and myalgias.  We do believe that the benefits far outweigh the risks.  Plan treatment duration of 5 years.   At this time she is leaning towards tamoxifen.  I have given her printed  information about tamoxifen as well as aromatase inhibitors.  She will return to clinic after radiation to initiate antiestrogen therapy.

## 2022-09-29 ENCOUNTER — Ambulatory Visit
Admission: RE | Admit: 2022-09-29 | Discharge: 2022-09-29 | Disposition: A | Discharge status: Home / Self Care | Payer: 59 | Source: Ambulatory Visit | Attending: General Surgery | Admitting: General Surgery

## 2022-09-29 ENCOUNTER — Encounter: Payer: Self-pay | Admitting: Genetic Counselor

## 2022-09-29 DIAGNOSIS — C50411 Malignant neoplasm of upper-outer quadrant of right female breast: Secondary | ICD-10-CM

## 2022-09-29 HISTORY — PX: BREAST BIOPSY: SHX20

## 2022-09-29 NOTE — Progress Notes (Signed)
REFERRING PROVIDER: Rachel Moulds, MD  PRIMARY PROVIDER:  Joycelyn Rua, MD  PRIMARY REASON FOR VISIT:  Encounter Diagnoses  Name Primary?   Ductal carcinoma in situ (DCIS) of right breast Yes   Family history of breast cancer    HISTORY OF PRESENT ILLNESS:   April Maynard, a 62 y.o. female, was seen for a [facility] cancer genetics consultation at the request of April Maynard due to a personal and family history of cancer.  April Maynard presents to clinic today to discuss the possibility of a hereditary predisposition to cancer, to discuss genetic testing, and to further clarify her future cancer risks, as well as potential cancer risks for family members.   In July 2024, at the age of 42, April Maynard was diagnosed with ductal carcinoma in situ of the right breast (ER/PR positive). The treatment plan includes a lumpectomy on Thursday, 09/30/2022.  CANCER HISTORY:  Oncology History  Ductal carcinoma in situ (DCIS) of right breast  02/10/2022 Mammogram   Stable probably benign right breast calcs noted in Jan 2024. 6 month follow up mammogram showed increasing indeterminate calcs right breast.   08/27/2022 Pathology Results   Right breast needle core biopsy showed DCIS, solid type, intermediate to high nuclear grade, necrosis present, calcifications present, prognostic showed ER 100% positive strong staining PR 2% positive strong staining   09/08/2022 Initial Diagnosis   Ductal carcinoma in situ (DCIS) of right breast   09/28/2022 Cancer Staging   Staging form: Breast, AJCC 8th Edition - Clinical: Stage 0 (cTis (DCIS), cN0, cM0, G2, ER+, PR+) - Signed by April Moulds, MD on 09/28/2022 Stage prefix: Initial diagnosis Histologic grading system: 3 grade system     RISK FACTORS:  First live birth at age 55.  OCP use for approximately 4-5 years.  Ovaries intact: no.  Uterus intact: no.  Menopausal status: postmenopausal.  HRT use: she reports she tried it once Colonoscopy: yes;  she reports  a history of benign colon polyps . Mammogram within the last year: yes. Any excessive radiation exposure in the past: no  Past Medical History:  Diagnosis Date   Alternating exotropia    Anxiety    Bilateral eye strain    Breast cancer (HCC) 08/27/2022   Depression    Dissociated vertical deviation    Family history of breast cancer    Hypertension     Past Surgical History:  Procedure Laterality Date   BREAST BIOPSY Right 08/27/2022   MM RT BREAST BX W LOC DEV 1ST LESION IMAGE BX SPEC STEREO GUIDE 08/27/2022 [facility]MOGR[facility]   STRABISMUS SURGERY Bilateral 11/17/2015   SUPRACERVICAL ABDOMINAL HYSTERECTOMY      Social History   Socioeconomic History   Marital status: Married    Spouse name: Not on file   Number of children: Not on file   Years of education: Not on file   Highest education level: Not on file  Occupational History   Not on file  Tobacco Use   Smoking status: Never   Smokeless tobacco: Never  Vaping Use   Vaping status: Never Used  Substance and Sexual Activity   Alcohol use: Yes    Comment: occ   Drug use: Never   Sexual activity: Not on file  Other Topics Concern   Not on file  Social History Narrative   Not on file   Social Determinants of Health   Financial Resource Strain: Not on file  Food Insecurity: No Food Insecurity (09/09/2022)   Hunger Vital Sign  Worried About Programme researcher, broadcasting/film/video in the Last Year: Never true    Ran Out of Food in the Last Year: Never true  Transportation Needs: No Transportation Needs (09/09/2022)   PRAPARE - Administrator, Civil Service (Medical): No    Lack of Transportation (Non-Medical): No  Physical Activity: Not on file  Stress: Not on file  Social Connections: Not on file     FAMILY HISTORY:  We obtained a detailed, 4-generation family history.  Significant diagnoses are listed below:  April Maynard sister was diagnosed with breast cancer at age 68. April Maynard is unaware of previous family  history of genetic testing for hereditary cancer risks. There is no reported Ashkenazi Jewish ancestry.       GENETIC COUNSELING ASSESSMENT: April Maynard is a 62 y.o. female with a personal and family history of cancer which is somewhat suggestive of a hereditary predisposition to cancer. We, therefore, discussed and recommended the following at today's visit.   DISCUSSION: We discussed that 5 - 10% of cancer is hereditary, with most cases of breast cancer associated with BRCA1/2. There are other genes that can be associated with hereditary breast cancer syndromes.  We discussed that testing is beneficial for several reasons including knowing how to follow individuals after completing their treatment, identifying whether potential treatment options would be beneficial, and understanding if other family members could be at risk for cancer and allowing them to undergo genetic testing.   We reviewed the characteristics, features and inheritance patterns of hereditary cancer syndromes. We also discussed genetic testing, including the appropriate family members to test, the process of testing, insurance coverage and turn-around-time for results. We discussed the implications of a negative, positive, carrier and/or variant of uncertain significant result. We recommended April Maynard pursue genetic testing for a panel that includes genes associated with breast cancer.   April Maynard  was offered a common hereditary cancer panel (34 genes) and an expanded pan-cancer panel (71 genes). April Maynard was informed of the benefits and limitations of each panel, including that expanded pan-cancer panels contain genes that do not have clear management guidelines at this point in time.  We also discussed that as the number of genes included on a panel increases, the chances of variants of uncertain significance increases. After considering the benefits and limitations of each gene panel, April Maynard elected to have [facility] CancerNext  Panel.  The CancerNext gene panel offered by [facility] [facility] includes sequencing, rearrangement analysis, and RNA analysis for the following 34 genes:   APC, ATM, AXIN2, BARD1, BMPR1A, BRCA1, BRCA2, BRIP1, CDH1, CDK4, CDKN2A, CHEK2, DICER1, HOXB13, EPCAM, GREM1, MLH1, MSH2, MSH3, MSH6, MUTYH, NF1, NTHL1, PALB2, PMS2, POLD1, POLE, PTEN, RAD51C, RAD51D, SMAD4, SMARCA4, STK11, and TP53.   Based on Ms. Jess personal and family history of cancer, she meets medical criteria for genetic testing. Despite that she meets criteria, she may still have an out of pocket cost. We discussed that if her out of pocket cost for testing is over $100, the laboratory will call and confirm whether she wants to proceed with testing.  If the out of pocket cost of testing is less than $100 she will be billed by the genetic testing laboratory.   PLAN: After considering the risks, benefits, and limitations, Ms. Bena provided informed consent to pursue genetic testing and the blood sample was sent to [facility] for analysis of the CancerNext Panel. Results should be available within approximately 2-3 weeks' time, at which point they  will be disclosed by telephone to Ms. Benbrook, as will any additional recommendations warranted by these results. Ms. Zappia will receive a summary of her genetic counseling visit and a copy of her results once available. This information will also be available in Epic.   Ms. Semaan questions were answered to her satisfaction today. Our contact information was provided should additional questions or concerns arise. Thank you for the referral and allowing [place] to share in the care of your patient.   Lalla Brothers, MS, [facility] Genetic Counselor [email or web address] (P) [phone]  The patient was seen for a total of 30 minutes in face-to-face genetic counseling.  The patient brought her husband.  Drs. Pamelia Hoit and/or Mosetta Putt were available to discuss this case as needed.    _______________________________________________________________________ For Office Staff:  Number of people involved in session: 2 Was an Intern/ student involved with case: no

## 2022-09-29 NOTE — H&P (Signed)
REFERRING PHYSICIAN: Izola Price  PROVIDER: Matthias Hughs, MD  Care Team: Patient Care Team: Lise Auer, MD as PCP - General (Internal Medicine) Kathee Delton, OD as Optometrist (Optometry) Matthias Hughs, MD as Consulting Provider (Surgical Oncology)   MRN: [id number] DOB: 05/19/1960 DATE OF ENCOUNTER: 09/06/2022  Subjective   Chief Complaint: Right Breast DCIS   History of Present Illness: April Maynard is a 62 y.o. female who is seen today as an office consultation at the request of Dr. Izola Price for evaluation of Right Breast DCIS .   Patient was diagnosed with a screening detected right breast cancer July 2024. She had short-term follow-up for right breast calcifications. A area of 2.1 cm of calcifications was noted at 12:00 on the right. Core needle biopsy was performed. 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   Of note, the patient has a sister who had breast cancer in her late 47s. The patient is otherwise very healthy and only had 1 sibling. She is unaware of any cancers in her extended family.   She is accompanied by her husband.  Family cancer history - sister with breast cancer in her 30s and possible brain cancer  Diagnostic mammogram:08/24/2022  ACR Breast Density Category c: The breasts are heterogeneously dense, which may obscure small masses.  FINDINGS: Magnification views of the right breast were obtained. Increasing round and punctate calcifications are demonstrated within the superior posterior right breast. In total the group of calcifications measures 2.1 cm. The increasing component is located along the anterior inferior margin of the group of calcifications. Normal left breast.  IMPRESSION: Increasing indeterminate calcifications right breast.  RECOMMENDATION: Stereotactic guided core needle biopsy of the anterior inferior aspect of the increasing group  of right breast calcifications. If these are surgical or malignant in etiology, stereo of the additional group more superiorly can be obtained or these can be localized at the time of surgery.  I have discussed the findings and recommendations with the patient. If applicable, a reminder letter will be sent to the patient regarding the next appointment.  BI-RADS CATEGORY 4: Suspicious.  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 I have reviewed this information and discussed as appropriate with the patient. See HPI as well for other ROS. 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 Durel Salts)  Malignant neoplasm of upper-outer quadrant of right breast in female, estrogen receptor positive (CMS/HHS-HCC)  Family history of breast cancer   Past Surgical History:  Procedure Laterality Date  HYSTERECTOMY SUPRACERVICAL ABDOMINAL W/REMOVAL TUBES &/OR OVARIES 2010  STRABISMUS SURGERY 1 HORIZONTAL MUSCLE MEDIAL/LATERAL Bilateral 11/17/2015  Procedure: bilateral lateral rectus recession 8.46mm; Surgeon: Loma Sender, MD; [facility]; Service:  Ophthalmology; Laterality: Bilateral;  STRABISMUS SURGERY FOR SCARRING EXTRAOCULAR MUSCLE Left 11/17/2015  Procedure: STRABISMUS SURGERY ON PATIENT WITH SCARRING OF EXTRAOCULAR MUSCLES OR RESTRICTIVE MYOPATHY (LIST IN ADDITION TO PRIMARY PROCEDURE); Surgeon: Loma Sender, MD; [facility]; Service: Ophthalmology; Laterality: Left;  HYSTERECTOMY  STRABISMUS EYE SURGERY    Allergies  Allergen Reactions  Morphine Swelling   Current Outpatient Medications on File Prior to Visit  Medication Sig Dispense Refill  ascorbic acid, vitamin C, (VITAMIN C) 1000 MG tablet Take 1,000 mg by mouth once daily  calcium carbonate (TUMS E-X) 300 mg (750 mg) chewable tablet Take 300 mg of elemental by mouth every 2 (two) hours as needed for Heartburn.  cholecalciferol 1000 unit tablet Take by mouth  escitalopram oxalate (LEXAPRO) 20 MG tablet Take 20 mg by mouth once daily.  losartan (COZAAR) 25 MG tablet Take 25 mg by mouth once daily  ondansetron (ZOFRAN, AS HYDROCHLORIDE,) 4 MG tablet Take 1 tablet (4 mg total) by mouth every 8 (eight) hours as needed for Nausea. 20 tablet 0  prednisoLONE acetate (PRED FORTE) 1 % ophthalmic suspension Place 1 drop into both eyes 4 (four) times daily. 5 mL 0  tobramycin-dexamethasone (TOBRADEX) 0.3-0.1 % ophthalmic suspension Place 1 drop into both eyes 4 (four) times daily. 5 mL 0  zinc citrate-phytase (ZYTAZE) 25-500 mg capsule Take by mouth   No current facility-administered medications on file prior to visit.   Family History  Problem Relation Age of Onset  Thyroid disease Mother  Stroke Mother  High blood pressure (Hypertension) Mother  Diabetes Mother  Skin cancer Father  High blood pressure (Hypertension) Father  Diabetes Father  Thyroid disease Sister  Diabetes Sister  Breast cancer Sister  Glaucoma Neg Hx  Macular degeneration Neg Hx  Strabismus Neg Hx    Social History   Tobacco Use  Smoking Status Never  Smokeless Tobacco Never     Social History   Socioeconomic History  Marital status: Married  Tobacco Use  Smoking status: Never  Smokeless tobacco: Never  Substance and Sexual Activity  Alcohol use: Yes  Comment: Occasionally  Drug use: Never   Objective:   Vitals:  09/06/22 1144  BP: 124/80  Pulse: 82  Temp: 36.9 C (98.5 F)  SpO2: 99%  Weight: 72.8 kg (160 lb 6.4 oz)  Height: 167.6 cm (5\' 6" )  PainSc: 0-No pain   Body mass index is 25.89 kg/m.  Gen: No acute distress. Well nourished and well groomed.  Neurological: Alert and oriented to person, place, and time. Coordination normal.  Head: Normocephalic and atraumatic.  Eyes: Conjunctivae are normal. Pupils are equal, round, and reactive to light. No scleral icterus.  Neck: Normal range of motion. Neck supple. No tracheal deviation or thyromegaly present.  Cardiovascular: Normal rate, regular rhythm, normal heart sounds and intact distal pulses. Exam reveals no gallop and no friction rub. No murmur heard. Breast: relatively symmetric, no palpable masses either breast. No significant bruising. No nipple retraction or nipple discharge. No skin dimpling. No lymphadenopathy. No contour changes. Respiratory: Effort normal. No respiratory distress. No chest wall tenderness. Breath sounds normal. No wheezes, rales or rhonchi.  GI: Soft. Bowel sounds are normal. The abdomen is soft and nontender. There is no rebound and no guarding.  Musculoskeletal: Normal range of motion. Extremities are nontender.  Lymphadenopathy: No cervical, preauricular, postauricular or axillary adenopathy is present Skin: Skin is warm and dry. No rash noted. No diaphoresis. No erythema. No pallor. No  clubbing, cyanosis, or edema.  Psychiatric: Normal mood and affect. Behavior is normal. Judgment and thought content normal.   Labs N/a  Assessment and Plan:   ICD-10-CM  1. Malignant neoplasm of upper-outer quadrant of right breast in female, estrogen receptor positive  (CMS/HHS-HCC) C50.411 Ambulatory Referral to Oncology-Medical  Z17.0 Ambulatory Referral to Radiation Oncology  Ambulatory Referral to Cancer Genetics - [id number]   2. Family history of breast cancer Z80.3    Patient has a new diagnosis of stage 0 right breast cancer. This is amenable to breast conservation. I discussed seed localized lumpectomy with the patient and her spouse. I reviewed the process of seed placement. I reviewed surgery with the patient. I discussed the incision type and that we would need radiology involved 1 to 2 days prior to place the seed. I discussed the intraoperative proceedings and how we determine whether or not to take additional margins at the time of surgery. I reviewed postoperative risks including bleeding, infection, chronic pain, need for additional surgeries, recurrent cancer, potential heart or lung issues, fluid buildup, breast lymphedema.  I suspect that surgery will subsequently be followed by radiation and antihormonal treatment. I have made referrals to medical and radiation oncology.  I will also refer the patient to genetics given her sister with breast cancer at such a young age.

## 2022-09-30 ENCOUNTER — Other Ambulatory Visit: Payer: Self-pay

## 2022-09-30 ENCOUNTER — Ambulatory Visit (HOSPITAL_BASED_OUTPATIENT_CLINIC_OR_DEPARTMENT_OTHER): Payer: 59 | Admitting: Anesthesiology

## 2022-09-30 ENCOUNTER — Ambulatory Visit (HOSPITAL_BASED_OUTPATIENT_CLINIC_OR_DEPARTMENT_OTHER)
Admission: RE | Admit: 2022-09-30 | Discharge: 2022-09-30 | Disposition: A | Discharge status: Home / Self Care | Payer: 59 | Attending: General Surgery | Admitting: General Surgery

## 2022-09-30 ENCOUNTER — Encounter (HOSPITAL_BASED_OUTPATIENT_CLINIC_OR_DEPARTMENT_OTHER): Payer: Self-pay | Admitting: General Surgery

## 2022-09-30 ENCOUNTER — Encounter (HOSPITAL_BASED_OUTPATIENT_CLINIC_OR_DEPARTMENT_OTHER)
Admission: RE | Disposition: A | Discharge status: Home / Self Care | Payer: Self-pay | Source: Home / Self Care | Attending: General Surgery

## 2022-09-30 ENCOUNTER — Ambulatory Visit
Admission: RE | Admit: 2022-09-30 | Discharge: 2022-09-30 | Disposition: A | Discharge status: Home / Self Care | Payer: 59 | Source: Ambulatory Visit | Attending: General Surgery | Admitting: General Surgery

## 2022-09-30 DIAGNOSIS — Z170 Estrogen receptor positive status [ER+]: Secondary | ICD-10-CM | POA: Diagnosis not present

## 2022-09-30 DIAGNOSIS — N6011 Diffuse cystic mastopathy of right breast: Secondary | ICD-10-CM | POA: Diagnosis not present

## 2022-09-30 DIAGNOSIS — D0511 Intraductal carcinoma in situ of right breast: Secondary | ICD-10-CM | POA: Insufficient documentation

## 2022-09-30 DIAGNOSIS — I10 Essential (primary) hypertension: Secondary | ICD-10-CM | POA: Insufficient documentation

## 2022-09-30 DIAGNOSIS — Z01818 Encounter for other preprocedural examination: Secondary | ICD-10-CM

## 2022-09-30 DIAGNOSIS — C50911 Malignant neoplasm of unspecified site of right female breast: Secondary | ICD-10-CM | POA: Diagnosis not present

## 2022-09-30 DIAGNOSIS — Z803 Family history of malignant neoplasm of breast: Secondary | ICD-10-CM | POA: Insufficient documentation

## 2022-09-30 HISTORY — DX: Essential (primary) hypertension: I10

## 2022-09-30 HISTORY — DX: Depression, unspecified: F32.A

## 2022-09-30 HISTORY — PX: BREAST LUMPECTOMY WITH RADIOACTIVE SEED LOCALIZATION: SHX6424

## 2022-09-30 HISTORY — DX: Anxiety disorder, unspecified: F41.9

## 2022-09-30 SURGERY — BREAST LUMPECTOMY WITH RADIOACTIVE SEED LOCALIZATION
Anesthesia: General | Site: Breast | Laterality: Right

## 2022-09-30 MED ORDER — DEXMEDETOMIDINE HCL IN NACL 80 MCG/20ML IV SOLN
INTRAVENOUS | Status: AC
  Filled 2022-09-30: qty 20

## 2022-09-30 MED ORDER — DEXAMETHASONE SODIUM PHOSPHATE 10 MG/ML IJ SOLN
INTRAMUSCULAR | Status: AC
  Filled 2022-09-30: qty 1

## 2022-09-30 MED ORDER — CEFAZOLIN SODIUM-DEXTROSE 2-4 GM/100ML-% IV SOLN: 2.0000 g | INTRAVENOUS | Status: DC

## 2022-09-30 MED ORDER — ACETAMINOPHEN 500 MG PO TABS
1000.0000 mg | ORAL_TABLET | ORAL | Status: AC
  Administered 2022-09-30: 1000 mg via ORAL

## 2022-09-30 MED ORDER — DEXMEDETOMIDINE HCL IN NACL 80 MCG/20ML IV SOLN
INTRAVENOUS | Status: DC | PRN
  Administered 2022-09-30: 6 ug via INTRAVENOUS

## 2022-09-30 MED ORDER — PROPOFOL 10 MG/ML IV BOLUS
INTRAVENOUS | Status: DC | PRN
  Administered 2022-09-30: 150 ug via INTRAVENOUS

## 2022-09-30 MED ORDER — MIDAZOLAM HCL 2 MG/2ML IJ SOLN
INTRAMUSCULAR | Status: AC
  Filled 2022-09-30: qty 2

## 2022-09-30 MED ORDER — LACTATED RINGERS IV SOLN: INTRAVENOUS | Status: DC

## 2022-09-30 MED ORDER — ACETAMINOPHEN 500 MG PO TABS
ORAL_TABLET | ORAL | Status: AC
  Filled 2022-09-30: qty 2

## 2022-09-30 MED ORDER — EPHEDRINE 5 MG/ML INJ
INTRAVENOUS | Status: AC
  Filled 2022-09-30: qty 5

## 2022-09-30 MED ORDER — EPHEDRINE SULFATE-NACL 50-0.9 MG/10ML-% IV SOSY
PREFILLED_SYRINGE | INTRAVENOUS | Status: DC | PRN
  Administered 2022-09-30: 10 mg via INTRAVENOUS

## 2022-09-30 MED ORDER — OXYCODONE HCL 5 MG PO TABS: 5.0000 mg | ORAL_TABLET | Freq: Four times a day (QID) | ORAL | 0 refills | Status: AC | PRN

## 2022-09-30 MED ORDER — OXYCODONE HCL 5 MG/5ML PO SOLN: 5.0000 mg | Freq: Once | ORAL | Status: DC | PRN

## 2022-09-30 MED ORDER — CEFAZOLIN SODIUM-DEXTROSE 2-4 GM/100ML-% IV SOLN
INTRAVENOUS | Status: AC
  Filled 2022-09-30: qty 100

## 2022-09-30 MED ORDER — CEFAZOLIN SODIUM-DEXTROSE 2-3 GM-%(50ML) IV SOLR
INTRAVENOUS | Status: DC | PRN
  Administered 2022-09-30: 2 g via INTRAVENOUS

## 2022-09-30 MED ORDER — OXYCODONE HCL 5 MG PO TABS: 5.0000 mg | ORAL_TABLET | Freq: Once | ORAL | Status: DC | PRN

## 2022-09-30 MED ORDER — 0.9 % SODIUM CHLORIDE (POUR BTL) OPTIME
TOPICAL | Status: DC | PRN
  Administered 2022-09-30: 400 mL

## 2022-09-30 MED ORDER — PROMETHAZINE HCL 25 MG/ML IJ SOLN: 6.2500 mg | INTRAMUSCULAR | Status: DC | PRN

## 2022-09-30 MED ORDER — MIDAZOLAM HCL 2 MG/2ML IJ SOLN
INTRAMUSCULAR | Status: DC | PRN
  Administered 2022-09-30 (×2): 1 mg via INTRAVENOUS

## 2022-09-30 MED ORDER — HEMOSTATIC AGENTS (NO CHARGE) OPTIME
TOPICAL | Status: DC | PRN
Start: 2022-09-30 — End: 2022-09-30
  Administered 2022-09-30: 1 via TOPICAL

## 2022-09-30 MED ORDER — FENTANYL CITRATE (PF) 100 MCG/2ML IJ SOLN: 25.0000 ug | INTRAMUSCULAR | Status: DC | PRN

## 2022-09-30 MED ORDER — PROPOFOL 10 MG/ML IV BOLUS
INTRAVENOUS | Status: AC
  Filled 2022-09-30: qty 20

## 2022-09-30 MED ORDER — FENTANYL CITRATE (PF) 100 MCG/2ML IJ SOLN
INTRAMUSCULAR | Status: DC | PRN
  Administered 2022-09-30: 25 ug via INTRAVENOUS
  Administered 2022-09-30: 50 ug via INTRAVENOUS
  Administered 2022-09-30: 25 ug via INTRAVENOUS

## 2022-09-30 MED ORDER — CELECOXIB 200 MG PO CAPS
ORAL_CAPSULE | ORAL | Status: AC
  Filled 2022-09-30: qty 1

## 2022-09-30 MED ORDER — LIDOCAINE 2% (20 MG/ML) 5 ML SYRINGE
INTRAMUSCULAR | Status: DC | PRN
  Administered 2022-09-30: 80 mg via INTRAVENOUS

## 2022-09-30 MED ORDER — CELECOXIB 200 MG PO CAPS
200.0000 mg | ORAL_CAPSULE | Freq: Once | ORAL | Status: AC
  Administered 2022-09-30: 200 mg via ORAL

## 2022-09-30 MED ORDER — LIDOCAINE HCL (PF) 1 % IJ SOLN
INTRAMUSCULAR | Status: DC | PRN
  Administered 2022-09-30: 20 mL

## 2022-09-30 MED ORDER — LIDOCAINE 2% (20 MG/ML) 5 ML SYRINGE
INTRAMUSCULAR | Status: AC
  Filled 2022-09-30: qty 5

## 2022-09-30 MED ORDER — ACETAMINOPHEN 500 MG PO TABS: 1000.0000 mg | ORAL_TABLET | Freq: Once | ORAL | Status: DC

## 2022-09-30 MED ORDER — FENTANYL CITRATE (PF) 100 MCG/2ML IJ SOLN
INTRAMUSCULAR | Status: AC
  Filled 2022-09-30: qty 2

## 2022-09-30 MED ORDER — ONDANSETRON HCL 4 MG/2ML IJ SOLN
INTRAMUSCULAR | Status: DC | PRN
Start: 2022-09-30 — End: 2022-09-30
  Administered 2022-09-30: 4 mg via INTRAVENOUS

## 2022-09-30 MED ORDER — DEXAMETHASONE SODIUM PHOSPHATE 10 MG/ML IJ SOLN
INTRAMUSCULAR | Status: DC | PRN
  Administered 2022-09-30: 10 mg via INTRAVENOUS

## 2022-09-30 SURGICAL SUPPLY — 53 items
ADH SKN CLS APL DERMABOND .7 (GAUZE/BANDAGES/DRESSINGS) ×1
APL PRP STRL LF DISP 70% ISPRP (MISCELLANEOUS) ×1
BINDER BREAST LRG (GAUZE/BANDAGES/DRESSINGS) IMPLANT
BINDER BREAST MEDIUM (GAUZE/BANDAGES/DRESSINGS) IMPLANT
BINDER BREAST XLRG (GAUZE/BANDAGES/DRESSINGS) IMPLANT
BINDER BREAST XXLRG (GAUZE/BANDAGES/DRESSINGS) IMPLANT
BLADE SURG 10 STRL SS (BLADE) ×1 IMPLANT
BLADE SURG 15 STRL LF DISP TIS (BLADE) IMPLANT
BLADE SURG 15 STRL SS (BLADE)
CANISTER SUC SOCK COL 7IN (MISCELLANEOUS) IMPLANT
CANISTER SUCT 1200ML W/VALVE (MISCELLANEOUS) IMPLANT
CHLORAPREP W/TINT 26 (MISCELLANEOUS) ×1 IMPLANT
CLIP TI LARGE 6 (CLIP) ×1 IMPLANT
CLIP TI MEDIUM 6 (CLIP) IMPLANT
COVER BACK TABLE 60X90IN (DRAPES) ×1 IMPLANT
COVER MAYO STAND STRL (DRAPES) ×1 IMPLANT
COVER PROBE CYLINDRICAL 5X96 (MISCELLANEOUS) ×1 IMPLANT
DERMABOND ADVANCED .7 DNX12 (GAUZE/BANDAGES/DRESSINGS) ×1 IMPLANT
DRAPE LAPAROSCOPIC ABDOMINAL (DRAPES) ×1 IMPLANT
DRAPE UTILITY XL STRL (DRAPES) ×1 IMPLANT
ELECT COATED BLADE 2.86 ST (ELECTRODE) ×1 IMPLANT
ELECT REM PT RETURN 9FT ADLT (ELECTROSURGICAL) ×1
ELECTRODE REM PT RTRN 9FT ADLT (ELECTROSURGICAL) ×1 IMPLANT
GAUZE SPONGE 4X4 12PLY STRL LF (GAUZE/BANDAGES/DRESSINGS) ×1 IMPLANT
GLOVE BIO SURGEON STRL SZ 6 (GLOVE) ×1 IMPLANT
GLOVE BIOGEL PI IND STRL 6.5 (GLOVE) ×1 IMPLANT
GOWN STRL REUS W/ TWL LRG LVL3 (GOWN DISPOSABLE) ×1 IMPLANT
GOWN STRL REUS W/ TWL XL LVL3 (GOWN DISPOSABLE) ×1 IMPLANT
GOWN STRL REUS W/TWL LRG LVL3 (GOWN DISPOSABLE) ×1
GOWN STRL REUS W/TWL XL LVL3 (GOWN DISPOSABLE) ×1
HEMOSTAT ARISTA ABSORB 3G PWDR (HEMOSTASIS) IMPLANT
KIT MARKER MARGIN INK (KITS) ×1 IMPLANT
LIGHT WAVEGUIDE WIDE FLAT (MISCELLANEOUS) IMPLANT
NDL HYPO 25X1 1.5 SAFETY (NEEDLE) ×1 IMPLANT
NEEDLE HYPO 25X1 1.5 SAFETY (NEEDLE) ×1
NS IRRIG 1000ML POUR BTL (IV SOLUTION) ×1 IMPLANT
PACK BASIN DAY SURGERY FS (CUSTOM PROCEDURE TRAY) ×1 IMPLANT
PENCIL SMOKE EVACUATOR (MISCELLANEOUS) ×1 IMPLANT
SLEEVE SCD COMPRESS KNEE MED (STOCKING) ×1 IMPLANT
SPIKE FLUID TRANSFER (MISCELLANEOUS) IMPLANT
SPONGE T-LAP 18X18 ~~LOC~~+RFID (SPONGE) ×1 IMPLANT
STRIP CLOSURE SKIN 1/2X4 (GAUZE/BANDAGES/DRESSINGS) ×1 IMPLANT
SUT MNCRL AB 4-0 PS2 18 (SUTURE) ×1 IMPLANT
SUT SILK 2 0 SH (SUTURE) IMPLANT
SUT VIC AB 2-0 SH 27 (SUTURE) ×1
SUT VIC AB 2-0 SH 27XBRD (SUTURE) ×1 IMPLANT
SUT VIC AB 3-0 SH 27 (SUTURE) ×1
SUT VIC AB 3-0 SH 27X BRD (SUTURE) ×1 IMPLANT
SYR CONTROL 10ML LL (SYRINGE) ×1 IMPLANT
TOWEL GREEN STERILE FF (TOWEL DISPOSABLE) ×1 IMPLANT
TRAY FAXITRON CT DISP (TRAY / TRAY PROCEDURE) ×1 IMPLANT
TUBE CONNECTING 20X1/4 (TUBING) IMPLANT
YANKAUER SUCT BULB TIP NO VENT (SUCTIONS) IMPLANT

## 2022-09-30 NOTE — Transfer of Care (Signed)
Immediate Anesthesia Transfer of Care Note  Patient: April Maynard  Procedure(s) Performed: RIGHT BREAST LUMPECTOMY WITH RADIOACTIVE SEED LOCALIZATION (Right: Breast)  Patient Location: PACU  Anesthesia Type:General  Level of Consciousness: drowsy and patient cooperative  Airway & Oxygen Therapy: Patient Spontanous Breathing and Patient connected to face mask oxygen  Post-op Assessment: Report given to RN and Post -op Vital signs reviewed and stable  Post vital signs: Reviewed and stable  Last Vitals:  Vitals Value Taken Time  BP 141/81 09/30/22 1637  Temp    Pulse 99 09/30/22 1639  Resp 20 09/30/22 1639  SpO2 100 % 09/30/22 1639  Vitals shown include unfiled device data.  Last Pain:  Vitals:   09/30/22 1340  TempSrc: Oral  PainSc: 0-No pain      Patients Stated Pain Goal: 4 (09/30/22 1340)  Complications: No notable events documented.

## 2022-09-30 NOTE — Discharge Instructions (Addendum)
[facility] Office Phone Number [phone]  BREAST BIOPSY/ PARTIAL MASTECTOMY: POST OP INSTRUCTIONS  Always review your discharge instruction sheet given to you by the facility where your surgery was performed.  IF YOU HAVE DISABILITY OR FAMILY LEAVE FORMS, YOU MUST BRING THEM TO THE OFFICE FOR PROCESSING.  DO NOT GIVE THEM TO YOUR DOCTOR.  Take 2 tylenol (acetominophen) three times a day for 3 days.  If you still have pain, add ibuprofen with food in between if able to take this (if you have kidney issues or stomach issues, do not take ibuprofen).  If both of those are not enough, add the narcotic pain pill.  If you find you are needing a lot of this overnight after surgery, call the next morning for a refill.    Prescriptions will not be filled after 5pm or on week-ends. Take your usually prescribed medications unless otherwise directed You should eat very light the first 24 hours after surgery, such as soup, crackers, pudding, etc.  Resume your normal diet the day after surgery. Most patients will experience some swelling and bruising in the breast.  Ice packs and a good support bra will help.  Swelling and bruising can take several days to resolve.  It is common to experience some constipation if taking pain medication after surgery.  Increasing fluid intake and taking a stool softener will usually help or prevent this problem from occurring.  A mild laxative (Milk of Magnesia or Miralax) should be taken according to package directions if there are no bowel movements after 48 hours. Unless discharge instructions indicate otherwise, you may remove your bandages 48 hours after surgery, and you may shower at that time.  You may have steri-strips (small skin tapes) in place directly over the incision.  These strips should be left on the skin at least for for 7-10 days.    ACTIVITIES:  You may resume regular daily activities (gradually increasing) beginning the next day.  Wearing a  good support bra or sports bra (or the breast binder) minimizes pain and swelling.  You may have sexual intercourse when it is comfortable. No heavy lifting for 1-2 weeks (not over around 10 pounds).  You may drive when you no longer are taking prescription pain medication, you can comfortably wear a seatbelt, and you can safely maneuver your car and apply brakes. RETURN TO WORK:  __________3-14 days depending on job. _______________ Bonita Quin should see your doctor in the office for a follow-up appointment approximately two weeks after your surgery.  Your doctor's nurse will typically make your follow-up appointment when she calls you with your pathology report.  Expect your pathology report 3-4 business days after your surgery.  You may call to check if you do not hear from [place] after three days. Next tylenol dose 0800pm. Next motrin 10pm.   WHEN TO CALL YOUR DOCTOR: Fever over 101.0 Nausea and/or vomiting. Extreme swelling or bruising. Continued bleeding from incision. Post Anesthesia Home Care Instructions  Activity: Get plenty of rest for the remainder of the day. A responsible individual must stay with you for 24 hours following the procedure.  For the next 24 hours, DO NOT: -Drive a car -Advertising copywriter -Drink alcoholic beverages -Take any medication unless instructed by your physician -Make any legal decisions or sign important papers.  Meals: Start with liquid foods such as gelatin or soup. Progress to regular foods as tolerated. Avoid greasy, spicy, heavy foods. If nausea and/or vomiting occur, drink only clear liquids until the  nausea and/or vomiting subsides. Call your physician if vomiting continues.  Special Instructions/Symptoms: Your throat may feel dry or sore from the anesthesia or the breathing tube placed in your throat during surgery. If this causes discomfort, gargle with warm salt water. The discomfort should disappear within 24 hours.  If you had a scopolamine patch  placed behind your ear for the management of post- operative nausea and/or vomiting:  The medication in the patch is effective for 72 hours, after which it should be removed.  Wrap patch in a tissue and discard in the trash. Wash hands thoroughly with soap and water. .You may remove the patch earlier than 72 hours if you experience unpleasant side effects which may include dry mouth, dizziness or visual disturbances. .Avoid touching the patch. Wash your hands with soap and water after contact with the patch.    Post Anesthesia Home Care Instructions  Activity: Get plenty of rest for the remainder of the day. A responsible individual must stay with you for 24 hours following the procedure.  For the next 24 hours, DO NOT: -Drive a car -Advertising copywriter -Drink alcoholic beverages -Take any medication unless instructed by your physician -Make any legal decisions or sign important papers.  Meals: Start with liquid foods such as gelatin or soup. Progress to regular foods as tolerated. Avoid greasy, spicy, heavy foods. If nausea and/or vomiting occur, drink only clear liquids until the nausea and/or vomiting subsides. Call your physician if vomiting continues.  Special Instructions/Symptoms: Your throat may feel dry or sore from the anesthesia or the breathing tube placed in your throat during surgery. If this causes discomfort, gargle with warm salt water. The discomfort should disappear within 24 hours.  If you had a scopolamine patch placed behind your ear for the management of post- operative nausea and/or vomiting:  1. The medication in the patch is effective for 72 hours, after which it should be removed.  Wrap patch in a tissue and discard in the trash. Wash hands thoroughly with soap and water. 2. You may remove the patch earlier than 72 hours if you experience unpleasant side effects which may include dry mouth, dizziness or visual disturbances. 3. Avoid touching the patch. Wash your  hands with soap and water after contact with the patch.    Increased pain, redness, or drainage from the incision.  The clinic staff is available to answer your questions during regular business hours.  Please don't hesitate to call and ask to speak to one of the nurses for clinical concerns.  If you have a medical emergency, go to the nearest emergency room or call 911.  A surgeon from [facility] is always on call at the hospital.  For further questions, please visit [email or web address]

## 2022-09-30 NOTE — Interval H&P Note (Signed)
History and Physical Interval Note:  09/30/2022 2:37 PM  April Maynard  has presented today for surgery, with the diagnosis of RIGHT BREAST CANCER.  The various methods of treatment have been discussed with the patient and family. After consideration of risks, benefits and other options for treatment, the patient has consented to  Procedure(s): RIGHT BREAST LUMPECTOMY WITH RADIOACTIVE SEED LOCALIZATION (Right) as a surgical intervention.  The patient's history has been reviewed, patient examined, no change in status, stable for surgery.  I have reviewed the patient's chart and labs.  Questions were answered to the patient's satisfaction.     Almond Lint

## 2022-09-30 NOTE — Anesthesia Procedure Notes (Signed)
Procedure Name: LMA Insertion Date/Time: 09/30/2022 2:58 PM  Performed by: Alvera Novel, CRNAPre-anesthesia Checklist: Patient identified, Emergency Drugs available, Suction available and Patient being monitored Patient Re-evaluated:Patient Re-evaluated prior to induction Oxygen Delivery Method: Circle System Utilized Preoxygenation: Pre-oxygenation with 100% oxygen Induction Type: IV induction Ventilation: Mask ventilation without difficulty LMA: LMA inserted LMA Size: 4.0 Number of attempts: 1 Placement Confirmation: positive ETCO2 Tube secured with: Tape Dental Injury: Teeth and Oropharynx as per pre-operative assessment

## 2022-09-30 NOTE — Anesthesia Preprocedure Evaluation (Addendum)
Anesthesia Evaluation  Patient identified by MRN, date of birth, ID band Patient awake    Reviewed: Allergy & Precautions, NPO status , Patient's Chart, lab work & pertinent test results  History of Anesthesia Complications Negative for: history of anesthetic complications  Airway Mallampati: II  TM Distance: >3 FB Neck ROM: Full    Dental  (+) Dental Advisory Given   Pulmonary neg pulmonary ROS   Pulmonary exam normal        Cardiovascular hypertension, Pt. on medications Normal cardiovascular exam     Neuro/Psych  PSYCHIATRIC DISORDERS Anxiety Depression    negative neurological ROS     GI/Hepatic negative GI ROS, Neg liver ROS,,,  Endo/Other  negative endocrine ROS    Renal/GU negative Renal ROS     Musculoskeletal negative musculoskeletal ROS (+)    Abdominal   Peds  Hematology negative hematology ROS (+)   Anesthesia Other Findings   Reproductive/Obstetrics  Breast cancer                              Anesthesia Physical Anesthesia Plan  ASA: 2  Anesthesia Plan: General   Post-op Pain Management: Tylenol PO (pre-op)* and Celebrex PO (pre-op)*   Induction: Intravenous  PONV Risk Score and Plan: 3 and Treatment may vary due to age or medical condition, Ondansetron, Dexamethasone and Midazolam  Airway Management Planned: LMA  Additional Equipment: None  Intra-op Plan:   Post-operative Plan: Extubation in OR  Informed Consent: I have reviewed the patients History and Physical, chart, labs and discussed the procedure including the risks, benefits and alternatives for the proposed anesthesia with the patient or authorized representative who has indicated his/her understanding and acceptance.     Dental advisory given  Plan Discussed with: CRNA and Anesthesiologist  Anesthesia Plan Comments:        Anesthesia Quick Evaluation

## 2022-09-30 NOTE — Op Note (Signed)
Right Breast Radioactive seed localized lumpectomy  Indications: This patient presents with history of right breast cancer, upper outer quadrant, cTis, intermediate to high grade DCIS, receptors+/+  Pre-operative Diagnosis: right breast cancer  Post-operative Diagnosis: Same  Surgeon: Almond Lint   Anesthesia: General endotracheal anesthesia  ASA Class: 2  Procedure Details  The patient was seen in the Holding Room. The risks, benefits, complications, treatment options, and expected outcomes were discussed with the patient. The possibilities of bleeding, infection, the need for additional procedures, failure to diagnose a condition, and creating a complication requiring other procedures or operations were discussed with the patient. The patient concurred with the proposed plan, giving informed consent.  The site of surgery properly noted/marked. The patient was taken to Operating Room # 8, identified, and the procedure verified as right breast seed localized lumpectomy.  The right breast and chest were prepped and draped in standard fashion. A superior circumareolar incision was made near the previously placed radioactive seed.  Dissection was carried down around the point of maximum signal intensity. The cautery was used to perform the dissection.   The specimen was inked with the margin marker paint kit.    Specimen radiography confirmed inclusion of the seed.  The background signal in the breast was zero.  Additional margins were taken at all the cardinal directions other than the posterior margin which was pectoralis.  The clip was in the superior margin.    Hemostasis was achieved with cautery.  The wound was irrigated.  The cavity was marked with clips on each border other than the anterior border. Arista was placed in the wound cavity.  The deeper tissues were reapproximated with 2-0 vicryl sutures.  The wound closed with 3-0 vicryl interrupted deep dermal sutures and 4-0 monocryl running  subcuticular suture.      Sterile dressings were applied. At the end of the operation, all sponge, instrument, and needle counts were correct.   Findings: Seed, clip in specimen.  anterior margin is skin, posterior margin is pectoralis.     Estimated Blood Loss:  min         Specimens: right breast tissue with seed, additional medial margin, additional superior margin, additional lateral margin, additional inferior margin, additional anterior margin.           Complications:  None; patient tolerated the procedure well.         Disposition: PACU - hemodynamically stable.         Condition: stable

## 2022-09-30 NOTE — Anesthesia Postprocedure Evaluation (Signed)
Anesthesia Post Note  Patient: April Maynard  Procedure(s) Performed: RIGHT BREAST LUMPECTOMY WITH RADIOACTIVE SEED LOCALIZATION (Right: Breast)     Patient location during evaluation: PACU Anesthesia Type: General Level of consciousness: awake and alert Pain management: pain level controlled Vital Signs Assessment: post-procedure vital signs reviewed and stable Respiratory status: spontaneous breathing, nonlabored ventilation and respiratory function stable Cardiovascular status: blood pressure returned to baseline and stable Postop Assessment: no apparent nausea or vomiting Anesthetic complications: no  No notable events documented.  Last Vitals:  Vitals:   09/30/22 1707 09/30/22 1718  BP: (!) 129/94 123/87  Pulse: 85 83  Resp: 16 16  Temp:  (!) 36.2 C  SpO2: 100% 100%    Last Pain:  Vitals:   09/30/22 1718  TempSrc:   PainSc: 0-No pain                 Akshay Spang,W. EDMOND

## 2022-10-01 ENCOUNTER — Encounter (HOSPITAL_BASED_OUTPATIENT_CLINIC_OR_DEPARTMENT_OTHER): Payer: Self-pay | Admitting: General Surgery

## 2022-10-07 LAB — SURGICAL PATHOLOGY

## 2022-10-18 ENCOUNTER — Encounter: Payer: Self-pay | Admitting: Genetic Counselor

## 2022-10-18 ENCOUNTER — Telehealth: Payer: Self-pay | Admitting: Genetic Counselor

## 2022-10-18 DIAGNOSIS — Z1379 Encounter for other screening for genetic and chromosomal anomalies: Secondary | ICD-10-CM | POA: Insufficient documentation

## 2022-10-18 NOTE — Telephone Encounter (Signed)
I contacted Ms. Commerford to discuss her genetic testing results. No pathogenic variants were identified in the 34 genes analyzed. Detailed clinic note to follow.  The test report has been scanned into EPIC and is located under the Molecular Pathology section of the Results Review tab.  A portion of the result report is included below for reference.   Lalla Brothers, MS, [facility] Genetic Counselor [email or web address] (P) [phone]

## 2022-10-20 ENCOUNTER — Ambulatory Visit: Payer: Self-pay | Admitting: Genetic Counselor

## 2022-10-20 ENCOUNTER — Encounter: Payer: Self-pay | Admitting: Genetic Counselor

## 2022-10-20 DIAGNOSIS — Z1379 Encounter for other screening for genetic and chromosomal anomalies: Secondary | ICD-10-CM

## 2022-10-20 NOTE — Progress Notes (Signed)
HPI:   April Maynard was previously seen in the [facility] due to a personal and family history of cancer and concerns regarding a hereditary predisposition to cancer. Please refer to our prior cancer genetics clinic note for more information regarding our discussion, assessment and recommendations, at the time. April Maynard recent genetic test results were disclosed to April Maynard, as were recommendations warranted by these results. These results and recommendations are discussed in more detail below.  CANCER HISTORY:  Oncology History  Ductal carcinoma in situ (DCIS) of right breast  02/10/2022 Mammogram   Stable probably benign right breast calcs noted in Jan 2024. 6 month follow up mammogram showed increasing indeterminate calcs right breast.   08/27/2022 Pathology Results   Right breast needle core biopsy showed DCIS, solid type, intermediate to high nuclear grade, necrosis present, calcifications present, prognostic showed ER 100% positive strong staining PR 2% positive strong staining   09/08/2022 Initial Diagnosis   Ductal carcinoma in situ (DCIS) of right breast   09/28/2022 Cancer Staging   Staging form: Breast, AJCC 8th Edition - Clinical: Stage 0 (cTis (DCIS), cN0, cM0, G2, ER+, PR+) - Signed by Rachel Moulds, MD on 09/28/2022 Stage prefix: Initial diagnosis Histologic grading system: 3 grade system    Genetic Testing   [facility] CancerNext Panel+RNA was Negative. Report date is 10/16/2022.   The CancerNext gene panel offered by [employer] includes sequencing, rearrangement analysis, and RNA analysis for the following 34 genes:   APC, ATM, AXIN2, BARD1, BMPR1A, BRCA1, BRCA2, BRIP1, CDH1, CDK4, CDKN2A, CHEK2, DICER1, HOXB13, EPCAM, GREM1, MLH1, MSH2, MSH3, MSH6, MUTYH, NF1, NTHL1, PALB2, PMS2, POLD1, POLE, PTEN, RAD51C, RAD51D, SMAD4, SMARCA4, STK11, and TP53.      FAMILY HISTORY:  We obtained a detailed, 4-generation family history.  Significant diagnoses are listed  below:   April Maynard Maynard was diagnosed with breast cancer at age 19. April Maynard is unaware of previous family history of genetic testing for hereditary cancer risks. There is no reported Ashkenazi Jewish ancestry.          GENETIC TEST RESULTS:  The [facility] CancerNext Panel found no pathogenic mutations.   The CancerNext gene panel offered by [employer] includes sequencing, rearrangement analysis, and RNA analysis for the following 34 genes:   APC, ATM, AXIN2, BARD1, BMPR1A, BRCA1, BRCA2, BRIP1, CDH1, CDK4, CDKN2A, CHEK2, DICER1, HOXB13, EPCAM, GREM1, MLH1, MSH2, MSH3, MSH6, MUTYH, NF1, NTHL1, PALB2, PMS2, POLD1, POLE, PTEN, RAD51C, RAD51D, SMAD4, SMARCA4, STK11, and TP53.   The test report has been scanned into EPIC and is located under the Molecular Pathology section of the Results Review tab.  A portion of the result report is included below for reference. Genetic testing reported out on 10/16/2022.       Even though a pathogenic variant was not identified, possible explanations for the cancer in the family may include: There may be no hereditary risk for cancer in the family. The cancers in April Maynard and/or April Maynard family may be due to other genetic or environmental factors. There may be a gene mutation in one of these genes that current testing methods cannot detect, but that chance is small. There could be another gene that has not yet been discovered, or that we have not yet tested, that is responsible for the cancer diagnoses in the family.  It is also possible there is a hereditary cause for the cancer in the family that April Maynard did not inherit.  Therefore, it is important to remain in  touch with cancer genetics in the future so that we can continue to offer April Maynard the most up to date genetic testing.   ADDITIONAL GENETIC TESTING:  We discussed with April Maynard that April Maynard genetic testing was fairly extensive.  If there are genes identified to increase cancer risk that can be  analyzed in the future, we would be happy to discuss and coordinate this testing at that time.    CANCER SCREENING RECOMMENDATIONS:  April Maynard test result is considered negative (normal).  This means that we have not identified a hereditary cause for April Maynard personal and family history of cancer at this time.   An individual's cancer risk and medical management are not determined by genetic test results alone. Overall cancer risk assessment incorporates additional factors, including personal medical history, family history, and any available genetic information that may result in a personalized plan for cancer prevention and surveillance. Therefore, it is recommended she continue to follow the cancer management and screening guidelines provided by April Maynard oncology and primary healthcare provider.  RECOMMENDATIONS FOR FAMILY MEMBERS:   Since she did not inherit a mutation in a cancer predisposition gene included on this panel, April Maynard children could not have inherited a mutation from April Maynard in one of these genes. Individuals in this family might be at some increased risk of developing cancer, over the general population risk, due to the family history of cancer. We recommend women in this family have a yearly mammogram beginning at age 43, or 89 years younger than the earliest onset of cancer, an annual clinical breast exam, and perform monthly breast self-exams.  Other members of the family may still carry a pathogenic variant in one of these genes that April Maynard did not inherit. Based on the family history, we recommend April Maynard have genetic counseling and testing.   FOLLOW-UP:  Cancer genetics is a rapidly advancing field and it is possible that new genetic tests will be appropriate for April Maynard and/or April Maynard family members in the future. We encouraged April Maynard to remain in contact with cancer genetics on an annual basis so we can update April Maynard personal and family histories and let April Maynard know of advances in cancer genetics that  may benefit this family.   Our contact number was provided. April Maynard questions were answered to April Maynard satisfaction, and she knows she is welcome to call us at anytime with additional questions or concerns.   Lalla Brothers, MS, [facility] Genetic Counselor [email or web address] (P) [phone]

## 2022-11-08 NOTE — Progress Notes (Signed)
Radiation Oncology         [phone] ________________________________  Name: April Maynard        MRN: [id number]  Date of Service: 11/09/2022 DOB: [birth year]  GM:WNUUVO, Jeannett Senior, MD  Almond Lint, MD     REFERRING PHYSICIAN: Almond Lint, MD   DIAGNOSIS: The encounter diagnosis was Ductal carcinoma in situ (DCIS) of right breast.   HISTORY OF PRESENT ILLNESS: April Maynard is a 62 y.o. female with a diagnosis of right breast cancer.  The patient had been following with diagnostic mammography due to right breast calcifications seen on prior screening.  These were noted to measure a total of 2.1 cm in the anterior inferior right breast.  She underwent stereotactic biopsy on 08/27/2022 which showed intermediate to high-grade DCIS with calcifications and necrosis that were ER/PR positive.    Since her last visit the patient underwent a right lumpectomy on 09/30/2022 which showed intermediate to high-grade DCIS measuring 1.2 cm in greatest dimension her margins were negative the closest being the posterior at 1mm.  She is seen to discuss adjuvant radiation.    PREVIOUS RADIATION THERAPY: No   PAST MEDICAL HISTORY:  Past Medical History:  Diagnosis Date   Alternating exotropia    Anxiety    Bilateral eye strain    Breast cancer (HCC) 08/27/2022   Depression    Dissociated vertical deviation    Family history of breast cancer    Hypertension        PAST SURGICAL HISTORY: Past Surgical History:  Procedure Laterality Date   BREAST BIOPSY Right 08/27/2022   MM RT BREAST BX W LOC DEV 1ST LESION IMAGE BX SPEC STEREO GUIDE 08/27/2022 [facility]   BREAST BIOPSY  09/29/2022   MM RT RADIOACTIVE SEED LOC MAMMO GUIDE 09/29/2022 [facility]   BREAST LUMPECTOMY WITH RADIOACTIVE SEED LOCALIZATION Right 09/30/2022   Procedure: RIGHT BREAST LUMPECTOMY WITH RADIOACTIVE SEED LOCALIZATION;  Surgeon: Almond Lint, MD;  Location: [facility];  Service: General;   Laterality: Right;   STRABISMUS SURGERY Bilateral 11/17/2015   SUPRACERVICAL ABDOMINAL HYSTERECTOMY       FAMILY HISTORY:  Family History  Problem Relation Age of Onset   Breast cancer Sister 66   BRCA 1/2 Neg Hx      SOCIAL HISTORY:  reports that she has never smoked. She has never used smokeless tobacco. She reports current alcohol use. She reports that she does not use drugs.  The patient is married and lives in [place].  She is a retired Engineer, site. She spends time in [place] caring for her grandchildren.   ALLERGIES: Morphine   MEDICATIONS:  Current Outpatient Medications  Medication Sig Dispense Refill   ALPRAZolam (XANAX) 0.25 MG tablet Take 0.25 mg by mouth 2 (two) times daily as needed.     ascorbic acid (VITAMIN C) 1000 MG tablet Take by mouth.     Cholecalciferol (VITAMIN D-1000 MAX ST) 25 MCG (1000 UT) tablet Take by mouth.     escitalopram (LEXAPRO) 20 MG tablet Take 20 mg by mouth daily.     fluticasone (FLONASE) 50 MCG/ACT nasal spray Use 2 squirts to each nostril 2 times daily for 7-10 days, then 2 squirts in each nare daily     losartan (COZAAR) 25 MG tablet Take by mouth.     oxyCODONE (OXY IR/ROXICODONE) 5 MG immediate release tablet Take 1 tablet (5 mg total) by mouth every 6 (six) hours as needed for severe pain. 8 tablet 0  traZODone (DESYREL) 50 MG tablet Take 50-100 mg by mouth at bedtime as needed.     Zinc 10 MG LOZG Zinc     No current facility-administered medications for this encounter.     REVIEW OF SYSTEMS: On review of systems, the patient reports that she is doing well since surgery. She had some discomfort and shooting pains in the breast which have improved but are self limiting within a few minutes. No other complaints are verbalized.      PHYSICAL EXAM:  Wt Readings from Last 3 Encounters:  11/09/22 160 lb (72.6 kg)  09/30/22 161 lb 2.5 oz (73.1 kg)  09/28/22 160 lb 6.4 oz (72.8 kg)   Temp Readings from Last 3 Encounters:   11/09/22 (!) 97.3 F (36.3 C) (Temporal)  09/30/22 (!) 97.2 F (36.2 C)  09/28/22 (!) 97.5 F (36.4 C) (Oral)   BP Readings from Last 3 Encounters:  11/09/22 117/87  09/30/22 123/87  09/28/22 (!) 149/85   Pulse Readings from Last 3 Encounters:  11/09/22 80  09/30/22 83  09/28/22 68    In general this is a well appearing caucasian female in no acute distress. She's alert and oriented x4 and appropriate throughout the examination. Cardiopulmonary assessment is negative for acute distress and she exhibits normal effort.  Her right breast reveals a well-healed surgical incision site without erythema separation or drainage.    ECOG = 1  0 - Asymptomatic (Fully active, able to carry on all predisease activities without restriction)  1 - Symptomatic but completely ambulatory (Restricted in physically strenuous activity but ambulatory and able to carry out work of a light or sedentary nature. For example, light housework, office work)  2 - Symptomatic, <50% in bed during the day (Ambulatory and capable of all self care but unable to carry out any work activities. Up and about more than 50% of waking hours)  3 - Symptomatic, >50% in bed, but not bedbound (Capable of only limited self-care, confined to bed or chair 50% or more of waking hours)  4 - Bedbound (Completely disabled. Cannot carry on any self-care. Totally confined to bed or chair)  5 - Death   Santiago Glad MM, Creech RH, Tormey DC, et al. [phone]). "Toxicity and response criteria of the [facility]". Am. Evlyn Clines. Oncol. 5 (6): 649-55    LABORATORY DATA:  Lab Results  Component Value Date   WBC 7.1 09/28/2022   HGB 13.9 09/28/2022   HCT 42.3 09/28/2022   MCV 89.6 09/28/2022   PLT 248 09/28/2022   Lab Results  Component Value Date   NA 137 09/28/2022   K 4.1 09/28/2022   CL 106 09/28/2022   CO2 24 09/28/2022   Lab Results  Component Value Date   ALT 12 09/28/2022   AST 22 09/28/2022   ALKPHOS  55 09/28/2022   BILITOT 0.5 09/28/2022      RADIOGRAPHY: No results found.     IMPRESSION/PLAN: 1. Intermediate to High Grade ER/PR positive DCIS of the right breast. Dr. Mitzi Hansen has reviewed the patient's final pathology findings and  today she and I discussed the nature of early stage breast disease.  She has done well since surgery and Dr. Mitzi Hansen recommends external radiotherapy to the breast  to reduce risks of local recurrence; and Dr. Al Pimple plans adjuvant antiestrogen therapy. We discussed the risks, benefits, short, and long term effects of radiotherapy, as well as the curative intent, and the patient is interested in proceeding. We reviewed the  delivery and logistics of radiotherapy and Dr. Mitzi Hansen recommends  4 weeks of radiotherapy to the right breast. Written consent is obtained and placed in the chart, a copy was provided to the patient. She will simulate on Friday.     In a visit lasting 45 minutes, greater than 50% of the time was spent face to face reviewing her case, as well as in preparation of, discussing, and coordinating the patient's care.      Osker Mason, [facility]    **Disclaimer: This note was dictated with voice recognition software. Similar sounding words can inadvertently be transcribed and this note may contain transcription errors which may not have been corrected upon publication of note.**

## 2022-11-09 ENCOUNTER — Ambulatory Visit
Admission: RE | Admit: 2022-11-09 | Discharge: 2022-11-09 | Discharge status: Home / Self Care | Payer: 59 | Source: Ambulatory Visit | Attending: Radiation Oncology | Admitting: Radiation Oncology

## 2022-11-09 ENCOUNTER — Ambulatory Visit: Payer: 59 | Admitting: Radiation Oncology

## 2022-11-09 ENCOUNTER — Ambulatory Visit
Admission: RE | Admit: 2022-11-09 | Discharge: 2022-11-09 | Disposition: A | Discharge status: Home / Self Care | Payer: 59 | Source: Ambulatory Visit | Attending: Radiation Oncology | Admitting: Radiation Oncology

## 2022-11-09 ENCOUNTER — Encounter: Payer: Self-pay | Admitting: Radiation Oncology

## 2022-11-09 VITALS — BP 117/87 | HR 80 | Temp 97.3°F | Resp 18 | Ht 67.0 in | Wt 160.0 lb

## 2022-11-09 DIAGNOSIS — I10 Essential (primary) hypertension: Secondary | ICD-10-CM | POA: Insufficient documentation

## 2022-11-09 DIAGNOSIS — Z79899 Other long term (current) drug therapy: Secondary | ICD-10-CM | POA: Insufficient documentation

## 2022-11-09 DIAGNOSIS — H5015 Alternating exotropia: Secondary | ICD-10-CM | POA: Diagnosis not present

## 2022-11-09 DIAGNOSIS — D0511 Intraductal carcinoma in situ of right breast: Secondary | ICD-10-CM | POA: Diagnosis present

## 2022-11-09 DIAGNOSIS — Z170 Estrogen receptor positive status [ER+]: Secondary | ICD-10-CM | POA: Insufficient documentation

## 2022-11-09 NOTE — Progress Notes (Signed)
Nursing interview for Ductal carcinoma in situ (DCIS) of right breast. Patient identity verified x2.  Patient reports RT breast tenderness 3/10.  Patient denies chest pains, SOB, or ABD pains.  Meaningful use complete. Total hysterectomy.  Vitals- BP 117/87 (BP Location: Left Arm, Patient Position: Sitting, Cuff Size: Normal)   Pulse 80   Temp (!) 97.3 F (36.3 C) (Temporal)   Resp 18   Ht 5\' 7"  (1.702 m)   Wt 160 lb (72.6 kg)   SpO2 100%   BMI 25.06 kg/m   This concludes the interaction.  Ruel Favors, LPN

## 2022-11-12 ENCOUNTER — Ambulatory Visit
Admission: RE | Admit: 2022-11-12 | Discharge: 2022-11-12 | Disposition: A | Discharge status: Home / Self Care | Payer: 59 | Source: Ambulatory Visit | Attending: Radiation Oncology | Admitting: Radiation Oncology

## 2022-11-12 DIAGNOSIS — Z510 Encounter for antineoplastic radiation therapy: Secondary | ICD-10-CM | POA: Diagnosis present

## 2022-11-12 DIAGNOSIS — D0511 Intraductal carcinoma in situ of right breast: Secondary | ICD-10-CM | POA: Insufficient documentation

## 2022-11-16 ENCOUNTER — Encounter: Payer: Self-pay | Admitting: *Deleted

## 2022-11-16 DIAGNOSIS — D0511 Intraductal carcinoma in situ of right breast: Secondary | ICD-10-CM

## 2022-11-24 ENCOUNTER — Inpatient Hospital Stay: Payer: 59 | Attending: Hematology and Oncology | Admitting: Hematology and Oncology

## 2022-11-25 DIAGNOSIS — Z510 Encounter for antineoplastic radiation therapy: Secondary | ICD-10-CM | POA: Diagnosis not present

## 2022-11-29 ENCOUNTER — Other Ambulatory Visit: Payer: Self-pay

## 2022-11-29 ENCOUNTER — Ambulatory Visit
Admission: RE | Admit: 2022-11-29 | Discharge: 2022-11-29 | Disposition: A | Discharge status: Home / Self Care | Payer: 59 | Source: Ambulatory Visit | Attending: Radiation Oncology | Admitting: Radiation Oncology

## 2022-11-29 DIAGNOSIS — Z510 Encounter for antineoplastic radiation therapy: Secondary | ICD-10-CM | POA: Diagnosis not present

## 2022-11-29 LAB — RAD ONC ARIA SESSION SUMMARY
Course Elapsed Days: 0
Plan Fractions Treated to Date: 1
Plan Prescribed Dose Per Fraction: 2.66 Gy
Plan Total Fractions Prescribed: 16
Plan Total Prescribed Dose: 42.56 Gy
Reference Point Dosage Given to Date: 2.66 Gy
Reference Point Session Dosage Given: 2.66 Gy
Session Number: 1

## 2022-11-30 ENCOUNTER — Ambulatory Visit
Admission: RE | Admit: 2022-11-30 | Discharge: 2022-11-30 | Disposition: A | Discharge status: Home / Self Care | Payer: 59 | Source: Ambulatory Visit | Attending: Radiation Oncology | Admitting: Radiation Oncology

## 2022-11-30 ENCOUNTER — Other Ambulatory Visit: Payer: Self-pay

## 2022-11-30 DIAGNOSIS — Z510 Encounter for antineoplastic radiation therapy: Secondary | ICD-10-CM | POA: Diagnosis not present

## 2022-11-30 LAB — RAD ONC ARIA SESSION SUMMARY
Course Elapsed Days: 1
Plan Fractions Treated to Date: 2
Plan Prescribed Dose Per Fraction: 2.66 Gy
Plan Total Fractions Prescribed: 16
Plan Total Prescribed Dose: 42.56 Gy
Reference Point Dosage Given to Date: 5.32 Gy
Reference Point Session Dosage Given: 2.66 Gy
Session Number: 2

## 2022-12-01 ENCOUNTER — Ambulatory Visit
Admission: RE | Admit: 2022-12-01 | Discharge: 2022-12-01 | Disposition: A | Discharge status: Home / Self Care | Payer: 59 | Source: Ambulatory Visit | Attending: Radiation Oncology | Admitting: Radiation Oncology

## 2022-12-01 ENCOUNTER — Other Ambulatory Visit: Payer: Self-pay

## 2022-12-01 DIAGNOSIS — Z510 Encounter for antineoplastic radiation therapy: Secondary | ICD-10-CM | POA: Diagnosis not present

## 2022-12-01 LAB — RAD ONC ARIA SESSION SUMMARY
Course Elapsed Days: 2
Plan Fractions Treated to Date: 3
Plan Prescribed Dose Per Fraction: 2.66 Gy
Plan Total Fractions Prescribed: 16
Plan Total Prescribed Dose: 42.56 Gy
Reference Point Dosage Given to Date: 7.98 Gy
Reference Point Session Dosage Given: 2.66 Gy
Session Number: 3

## 2022-12-02 ENCOUNTER — Other Ambulatory Visit: Payer: Self-pay

## 2022-12-02 ENCOUNTER — Ambulatory Visit
Admission: RE | Admit: 2022-12-02 | Discharge: 2022-12-02 | Disposition: A | Discharge status: Home / Self Care | Payer: 59 | Source: Ambulatory Visit | Attending: Radiation Oncology | Admitting: Radiation Oncology

## 2022-12-02 DIAGNOSIS — Z510 Encounter for antineoplastic radiation therapy: Secondary | ICD-10-CM | POA: Diagnosis not present

## 2022-12-02 LAB — RAD ONC ARIA SESSION SUMMARY
Course Elapsed Days: 3
Plan Fractions Treated to Date: 4
Plan Prescribed Dose Per Fraction: 2.66 Gy
Plan Total Fractions Prescribed: 16
Plan Total Prescribed Dose: 42.56 Gy
Reference Point Dosage Given to Date: 10.64 Gy
Reference Point Session Dosage Given: 2.66 Gy
Session Number: 4

## 2022-12-03 ENCOUNTER — Ambulatory Visit
Admission: RE | Admit: 2022-12-03 | Discharge: 2022-12-03 | Disposition: A | Discharge status: Home / Self Care | Payer: 59 | Source: Ambulatory Visit | Attending: Radiation Oncology | Admitting: Radiation Oncology

## 2022-12-03 ENCOUNTER — Other Ambulatory Visit: Payer: Self-pay

## 2022-12-03 ENCOUNTER — Ambulatory Visit: Payer: 59

## 2022-12-03 DIAGNOSIS — D0511 Intraductal carcinoma in situ of right breast: Secondary | ICD-10-CM | POA: Diagnosis present

## 2022-12-03 LAB — RAD ONC ARIA SESSION SUMMARY
Course Elapsed Days: 4
Plan Fractions Treated to Date: 5
Plan Prescribed Dose Per Fraction: 2.66 Gy
Plan Total Fractions Prescribed: 16
Plan Total Prescribed Dose: 42.56 Gy
Reference Point Dosage Given to Date: 13.3 Gy
Reference Point Session Dosage Given: 2.66 Gy
Session Number: 5

## 2022-12-06 ENCOUNTER — Ambulatory Visit
Admission: RE | Admit: 2022-12-06 | Discharge: 2022-12-06 | Disposition: A | Discharge status: Home / Self Care | Payer: 59 | Source: Ambulatory Visit | Attending: Radiation Oncology

## 2022-12-06 ENCOUNTER — Other Ambulatory Visit: Payer: Self-pay

## 2022-12-06 ENCOUNTER — Ambulatory Visit
Admission: RE | Admit: 2022-12-06 | Discharge: 2022-12-06 | Discharge status: Home / Self Care | Payer: 59 | Source: Ambulatory Visit | Attending: Radiation Oncology

## 2022-12-06 DIAGNOSIS — D0511 Intraductal carcinoma in situ of right breast: Secondary | ICD-10-CM

## 2022-12-06 LAB — RAD ONC ARIA SESSION SUMMARY
Course Elapsed Days: 7
Plan Fractions Treated to Date: 6
Plan Prescribed Dose Per Fraction: 2.66 Gy
Plan Total Fractions Prescribed: 16
Plan Total Prescribed Dose: 42.56 Gy
Reference Point Dosage Given to Date: 15.96 Gy
Reference Point Session Dosage Given: 2.66 Gy
Session Number: 6

## 2022-12-06 MED ORDER — ALRA NON-METALLIC DEODORANT (RAD-ONC)
1.0000 | Freq: Once | TOPICAL | Status: AC
  Administered 2022-12-06: 1 via TOPICAL

## 2022-12-06 MED ORDER — RADIAPLEXRX EX GEL: Freq: Once | CUTANEOUS | Status: AC

## 2022-12-07 ENCOUNTER — Ambulatory Visit
Admission: RE | Admit: 2022-12-07 | Discharge: 2022-12-07 | Disposition: A | Discharge status: Home / Self Care | Payer: 59 | Source: Ambulatory Visit | Attending: Radiation Oncology | Admitting: Radiation Oncology

## 2022-12-07 ENCOUNTER — Other Ambulatory Visit: Payer: Self-pay

## 2022-12-07 DIAGNOSIS — D0511 Intraductal carcinoma in situ of right breast: Secondary | ICD-10-CM | POA: Diagnosis not present

## 2022-12-07 LAB — RAD ONC ARIA SESSION SUMMARY
Course Elapsed Days: 8
Plan Fractions Treated to Date: 7
Plan Prescribed Dose Per Fraction: 2.66 Gy
Plan Total Fractions Prescribed: 16
Plan Total Prescribed Dose: 42.56 Gy
Reference Point Dosage Given to Date: 18.62 Gy
Reference Point Session Dosage Given: 2.66 Gy
Session Number: 7

## 2022-12-08 ENCOUNTER — Ambulatory Visit
Admission: RE | Admit: 2022-12-08 | Discharge: 2022-12-08 | Disposition: A | Discharge status: Home / Self Care | Payer: 59 | Source: Ambulatory Visit | Attending: Radiation Oncology

## 2022-12-08 ENCOUNTER — Other Ambulatory Visit: Payer: Self-pay

## 2022-12-08 DIAGNOSIS — D0511 Intraductal carcinoma in situ of right breast: Secondary | ICD-10-CM | POA: Diagnosis not present

## 2022-12-08 LAB — RAD ONC ARIA SESSION SUMMARY
Course Elapsed Days: 9
Plan Fractions Treated to Date: 8
Plan Prescribed Dose Per Fraction: 2.66 Gy
Plan Total Fractions Prescribed: 16
Plan Total Prescribed Dose: 42.56 Gy
Reference Point Dosage Given to Date: 21.28 Gy
Reference Point Session Dosage Given: 2.66 Gy
Session Number: 8

## 2022-12-09 ENCOUNTER — Ambulatory Visit
Admission: RE | Admit: 2022-12-09 | Discharge: 2022-12-09 | Disposition: A | Discharge status: Home / Self Care | Payer: 59 | Source: Ambulatory Visit | Attending: Radiation Oncology | Admitting: Radiation Oncology

## 2022-12-09 ENCOUNTER — Other Ambulatory Visit: Payer: Self-pay

## 2022-12-09 DIAGNOSIS — D0511 Intraductal carcinoma in situ of right breast: Secondary | ICD-10-CM | POA: Diagnosis not present

## 2022-12-09 LAB — RAD ONC ARIA SESSION SUMMARY
Course Elapsed Days: 10
Plan Fractions Treated to Date: 9
Plan Prescribed Dose Per Fraction: 2.66 Gy
Plan Total Fractions Prescribed: 16
Plan Total Prescribed Dose: 42.56 Gy
Reference Point Dosage Given to Date: 23.94 Gy
Reference Point Session Dosage Given: 2.66 Gy
Session Number: 9

## 2022-12-10 ENCOUNTER — Ambulatory Visit
Admission: RE | Admit: 2022-12-10 | Discharge: 2022-12-10 | Disposition: A | Discharge status: Home / Self Care | Payer: 59 | Source: Ambulatory Visit | Attending: Radiation Oncology

## 2022-12-10 ENCOUNTER — Other Ambulatory Visit: Payer: Self-pay

## 2022-12-10 ENCOUNTER — Ambulatory Visit
Admission: RE | Admit: 2022-12-10 | Discharge: 2022-12-10 | Disposition: A | Discharge status: Home / Self Care | Payer: 59 | Source: Ambulatory Visit | Attending: Radiation Oncology | Admitting: Radiation Oncology

## 2022-12-10 DIAGNOSIS — D0511 Intraductal carcinoma in situ of right breast: Secondary | ICD-10-CM | POA: Diagnosis not present

## 2022-12-10 LAB — RAD ONC ARIA SESSION SUMMARY
Course Elapsed Days: 11
Plan Fractions Treated to Date: 10
Plan Prescribed Dose Per Fraction: 2.66 Gy
Plan Total Fractions Prescribed: 16
Plan Total Prescribed Dose: 42.56 Gy
Reference Point Dosage Given to Date: 26.6 Gy
Reference Point Session Dosage Given: 2.66 Gy
Session Number: 10

## 2022-12-13 ENCOUNTER — Other Ambulatory Visit: Payer: Self-pay

## 2022-12-13 ENCOUNTER — Ambulatory Visit
Admission: RE | Admit: 2022-12-13 | Discharge: 2022-12-13 | Disposition: A | Discharge status: Home / Self Care | Payer: 59 | Source: Ambulatory Visit | Attending: Radiation Oncology | Admitting: Radiation Oncology

## 2022-12-13 DIAGNOSIS — D0511 Intraductal carcinoma in situ of right breast: Secondary | ICD-10-CM | POA: Diagnosis not present

## 2022-12-13 LAB — RAD ONC ARIA SESSION SUMMARY
Course Elapsed Days: 14
Plan Fractions Treated to Date: 11
Plan Prescribed Dose Per Fraction: 2.66 Gy
Plan Total Fractions Prescribed: 16
Plan Total Prescribed Dose: 42.56 Gy
Reference Point Dosage Given to Date: 29.26 Gy
Reference Point Session Dosage Given: 2.66 Gy
Session Number: 11

## 2022-12-14 ENCOUNTER — Other Ambulatory Visit: Payer: Self-pay

## 2022-12-14 ENCOUNTER — Ambulatory Visit
Admission: RE | Admit: 2022-12-14 | Discharge: 2022-12-14 | Disposition: A | Discharge status: Home / Self Care | Payer: 59 | Source: Ambulatory Visit | Attending: Radiation Oncology | Admitting: Radiation Oncology

## 2022-12-14 DIAGNOSIS — D0511 Intraductal carcinoma in situ of right breast: Secondary | ICD-10-CM | POA: Diagnosis not present

## 2022-12-14 LAB — RAD ONC ARIA SESSION SUMMARY
Course Elapsed Days: 15
Plan Fractions Treated to Date: 12
Plan Prescribed Dose Per Fraction: 2.66 Gy
Plan Total Fractions Prescribed: 16
Plan Total Prescribed Dose: 42.56 Gy
Reference Point Dosage Given to Date: 31.92 Gy
Reference Point Session Dosage Given: 2.66 Gy
Session Number: 12

## 2022-12-15 ENCOUNTER — Other Ambulatory Visit: Payer: Self-pay

## 2022-12-15 ENCOUNTER — Ambulatory Visit
Admission: RE | Admit: 2022-12-15 | Discharge: 2022-12-15 | Disposition: A | Discharge status: Home / Self Care | Payer: 59 | Source: Ambulatory Visit | Attending: Radiation Oncology

## 2022-12-15 DIAGNOSIS — D0511 Intraductal carcinoma in situ of right breast: Secondary | ICD-10-CM | POA: Diagnosis not present

## 2022-12-15 LAB — RAD ONC ARIA SESSION SUMMARY
Course Elapsed Days: 16
Plan Fractions Treated to Date: 13
Plan Prescribed Dose Per Fraction: 2.66 Gy
Plan Total Fractions Prescribed: 16
Plan Total Prescribed Dose: 42.56 Gy
Reference Point Dosage Given to Date: 34.58 Gy
Reference Point Session Dosage Given: 2.66 Gy
Session Number: 13

## 2022-12-16 ENCOUNTER — Other Ambulatory Visit: Payer: Self-pay

## 2022-12-16 ENCOUNTER — Ambulatory Visit
Admission: RE | Admit: 2022-12-16 | Discharge: 2022-12-16 | Disposition: A | Discharge status: Home / Self Care | Payer: 59 | Source: Ambulatory Visit | Attending: Radiation Oncology | Admitting: Radiation Oncology

## 2022-12-16 DIAGNOSIS — D0511 Intraductal carcinoma in situ of right breast: Secondary | ICD-10-CM | POA: Diagnosis not present

## 2022-12-16 LAB — RAD ONC ARIA SESSION SUMMARY
Course Elapsed Days: 17
Plan Fractions Treated to Date: 14
Plan Prescribed Dose Per Fraction: 2.66 Gy
Plan Total Fractions Prescribed: 16
Plan Total Prescribed Dose: 42.56 Gy
Reference Point Dosage Given to Date: 37.24 Gy
Reference Point Session Dosage Given: 2.66 Gy
Session Number: 14

## 2022-12-17 ENCOUNTER — Ambulatory Visit
Admission: RE | Admit: 2022-12-17 | Discharge: 2022-12-17 | Disposition: A | Discharge status: Home / Self Care | Payer: 59 | Source: Ambulatory Visit | Attending: Radiation Oncology | Admitting: Radiation Oncology

## 2022-12-17 ENCOUNTER — Other Ambulatory Visit: Payer: Self-pay

## 2022-12-17 ENCOUNTER — Ambulatory Visit: Payer: 59 | Admitting: Radiation Oncology

## 2022-12-17 DIAGNOSIS — D0511 Intraductal carcinoma in situ of right breast: Secondary | ICD-10-CM | POA: Diagnosis not present

## 2022-12-17 LAB — RAD ONC ARIA SESSION SUMMARY
Course Elapsed Days: 18
Plan Fractions Treated to Date: 15
Plan Prescribed Dose Per Fraction: 2.66 Gy
Plan Total Fractions Prescribed: 16
Plan Total Prescribed Dose: 42.56 Gy
Reference Point Dosage Given to Date: 39.9 Gy
Reference Point Session Dosage Given: 2.66 Gy
Session Number: 15

## 2022-12-20 ENCOUNTER — Other Ambulatory Visit: Payer: Self-pay

## 2022-12-20 ENCOUNTER — Ambulatory Visit
Admission: RE | Admit: 2022-12-20 | Discharge: 2022-12-20 | Disposition: A | Discharge status: Home / Self Care | Payer: 59 | Source: Ambulatory Visit | Attending: Radiation Oncology | Admitting: Radiation Oncology

## 2022-12-20 DIAGNOSIS — D0511 Intraductal carcinoma in situ of right breast: Secondary | ICD-10-CM | POA: Diagnosis not present

## 2022-12-20 LAB — RAD ONC ARIA SESSION SUMMARY
Course Elapsed Days: 21
Plan Fractions Treated to Date: 16
Plan Prescribed Dose Per Fraction: 2.66 Gy
Plan Total Fractions Prescribed: 16
Plan Total Prescribed Dose: 42.56 Gy
Reference Point Dosage Given to Date: 42.56 Gy
Reference Point Session Dosage Given: 2.66 Gy
Session Number: 16

## 2022-12-21 ENCOUNTER — Other Ambulatory Visit: Payer: Self-pay

## 2022-12-21 ENCOUNTER — Ambulatory Visit
Admission: RE | Admit: 2022-12-21 | Discharge: 2022-12-21 | Disposition: A | Discharge status: Home / Self Care | Payer: 59 | Source: Ambulatory Visit | Attending: Radiation Oncology | Admitting: Radiation Oncology

## 2022-12-21 DIAGNOSIS — D0511 Intraductal carcinoma in situ of right breast: Secondary | ICD-10-CM | POA: Diagnosis not present

## 2022-12-21 LAB — RAD ONC ARIA SESSION SUMMARY
Course Elapsed Days: 22
Plan Fractions Treated to Date: 1
Plan Prescribed Dose Per Fraction: 2 Gy
Plan Total Fractions Prescribed: 4
Plan Total Prescribed Dose: 8 Gy
Reference Point Dosage Given to Date: 2 Gy
Reference Point Session Dosage Given: 2 Gy
Session Number: 17

## 2022-12-22 ENCOUNTER — Ambulatory Visit
Admission: RE | Admit: 2022-12-22 | Discharge: 2022-12-22 | Disposition: A | Discharge status: Home / Self Care | Payer: 59 | Source: Ambulatory Visit | Attending: Radiation Oncology

## 2022-12-22 ENCOUNTER — Other Ambulatory Visit: Payer: Self-pay

## 2022-12-22 DIAGNOSIS — D0511 Intraductal carcinoma in situ of right breast: Secondary | ICD-10-CM | POA: Diagnosis not present

## 2022-12-22 LAB — RAD ONC ARIA SESSION SUMMARY
Course Elapsed Days: 23
Plan Fractions Treated to Date: 2
Plan Prescribed Dose Per Fraction: 2 Gy
Plan Total Fractions Prescribed: 4
Plan Total Prescribed Dose: 8 Gy
Reference Point Dosage Given to Date: 4 Gy
Reference Point Session Dosage Given: 2 Gy
Session Number: 18

## 2022-12-23 ENCOUNTER — Ambulatory Visit
Admission: RE | Admit: 2022-12-23 | Discharge: 2022-12-23 | Disposition: A | Discharge status: Home / Self Care | Payer: 59 | Source: Ambulatory Visit | Attending: Radiation Oncology | Admitting: Radiation Oncology

## 2022-12-23 ENCOUNTER — Other Ambulatory Visit: Payer: Self-pay

## 2022-12-23 DIAGNOSIS — D0511 Intraductal carcinoma in situ of right breast: Secondary | ICD-10-CM | POA: Diagnosis not present

## 2022-12-23 LAB — RAD ONC ARIA SESSION SUMMARY
Course Elapsed Days: 24
Plan Fractions Treated to Date: 3
Plan Prescribed Dose Per Fraction: 2 Gy
Plan Total Fractions Prescribed: 4
Plan Total Prescribed Dose: 8 Gy
Reference Point Dosage Given to Date: 6 Gy
Reference Point Session Dosage Given: 2 Gy
Session Number: 19

## 2022-12-24 ENCOUNTER — Other Ambulatory Visit: Payer: Self-pay

## 2022-12-24 ENCOUNTER — Ambulatory Visit
Admission: RE | Admit: 2022-12-24 | Discharge: 2022-12-24 | Disposition: A | Discharge status: Home / Self Care | Payer: 59 | Source: Ambulatory Visit | Attending: Radiation Oncology

## 2022-12-24 DIAGNOSIS — D0511 Intraductal carcinoma in situ of right breast: Secondary | ICD-10-CM | POA: Diagnosis not present

## 2022-12-24 LAB — RAD ONC ARIA SESSION SUMMARY
Course Elapsed Days: 25
Plan Fractions Treated to Date: 4
Plan Prescribed Dose Per Fraction: 2 Gy
Plan Total Fractions Prescribed: 4
Plan Total Prescribed Dose: 8 Gy
Reference Point Dosage Given to Date: 8 Gy
Reference Point Session Dosage Given: 2 Gy
Session Number: 20

## 2022-12-27 ENCOUNTER — Encounter: Payer: Self-pay | Admitting: *Deleted

## 2022-12-28 ENCOUNTER — Telehealth: Payer: Self-pay | Admitting: Hematology and Oncology

## 2022-12-28 NOTE — Telephone Encounter (Signed)
Scheduled appointment per scheduling message. Patient is aware of the appointment time and date.

## 2022-12-28 NOTE — Radiation Completion Notes (Addendum)
  Radiation Oncology         [phone] ________________________________  Name: April Maynard MRN: [id number]  Date of Service: 12/24/2022  DOB: 1960/07/28  End of Treatment Note     Diagnosis: Intermediate to High Grade ER/PR positive DCIS of the right breast.   Intent: Curative     ==========DELIVERED PLANS==========  First Treatment Date: 2022-11-29 - Last Treatment Date: 2022-12-24   Plan Name: Breast_R Site: Breast, Right Technique: 3D Mode: Photon Dose Per Fraction: 2.66 Gy Prescribed Dose (Delivered / Prescribed): 42.56 Gy / 42.56 Gy Prescribed Fxs (Delivered / Prescribed): 16 / 16   Plan Name: Breast_R_Bst Site: Breast, Right Technique: 3D Mode: Photon Dose Per Fraction: 2 Gy Prescribed Dose (Delivered / Prescribed): 8 Gy / 8 Gy Prescribed Fxs (Delivered / Prescribed): 4 / 4     ==========ON TREATMENT VISIT DATES========== 2022-12-06, 2022-12-10, 2022-12-17, 2022-12-24    See weekly On Treatment Notes in Epic for details.The patient tolerated radiation. She developed fatigue and anticipated skin changes in the treatment field.   The patient will receive a call in about one month from the radiation oncology department. She will continue follow up with Dr. Al Pimple as well.      Osker Mason, PAC

## 2023-01-05 ENCOUNTER — Inpatient Hospital Stay: Payer: 59 | Attending: Hematology and Oncology | Admitting: Hematology and Oncology

## 2023-01-05 VITALS — BP 148/86 | HR 76 | Temp 97.9°F | Resp 16 | Wt 158.0 lb

## 2023-01-05 DIAGNOSIS — Z803 Family history of malignant neoplasm of breast: Secondary | ICD-10-CM | POA: Insufficient documentation

## 2023-01-05 DIAGNOSIS — Z1721 Progesterone receptor positive status: Secondary | ICD-10-CM | POA: Diagnosis not present

## 2023-01-05 DIAGNOSIS — Z885 Allergy status to narcotic agent status: Secondary | ICD-10-CM | POA: Diagnosis not present

## 2023-01-05 DIAGNOSIS — Z79899 Other long term (current) drug therapy: Secondary | ICD-10-CM | POA: Insufficient documentation

## 2023-01-05 DIAGNOSIS — D0511 Intraductal carcinoma in situ of right breast: Secondary | ICD-10-CM | POA: Insufficient documentation

## 2023-01-05 DIAGNOSIS — Z90710 Acquired absence of both cervix and uterus: Secondary | ICD-10-CM | POA: Diagnosis not present

## 2023-01-05 DIAGNOSIS — Z923 Personal history of irradiation: Secondary | ICD-10-CM | POA: Diagnosis not present

## 2023-01-05 DIAGNOSIS — Z170 Estrogen receptor positive status [ER+]: Secondary | ICD-10-CM | POA: Diagnosis not present

## 2023-01-05 MED ORDER — TAMOXIFEN CITRATE 20 MG PO TABS: 20.0000 mg | ORAL_TABLET | Freq: Every day | ORAL | 3 refills | Status: DC

## 2023-01-05 NOTE — Assessment & Plan Note (Signed)
This is a very pleasant 62 year old postmenopausal female patient with newly diagnosed right breast DCIS, ER/PR positive s/p surgery and adj radiation here for follow up.  DCIS Post-Radiation Patient completed radiation therapy. Discussed the risks/benefits of Anastrozole vs Tamoxifen. Patient has concerns about bone density loss and prefers Tamoxifen. -Start Tamoxifen, to be dispensed to [facility]. -Check tolerance and side effects in 3-4 months at survivorship clinic visit.  General Health Maintenance / Followup Plans -Schedule survivorship clinic visit in 3-4 months. -Continue annual mammograms at the breast center. -Alternate follow-up visits between current provider and Dr. Donell Beers every 6 months.

## 2023-01-05 NOTE — Progress Notes (Signed)
[facility] CONSULT NOTE  Patient Care Team: Joycelyn Rua, MD as PCP - General (Family Medicine) Donnelly Angelica, RN as Oncology Nurse Navigator Lu Duffel, Margretta Ditty, RN as Oncology Nurse Navigator Almond Lint, MD as Consulting Physician (General Surgery) Rachel Moulds, MD as Consulting Physician (Hematology and Oncology) Dorothy Puffer, MD as Consulting Physician (Radiation Oncology)  CHIEF COMPLAINTS/PURPOSE OF CONSULTATION:  DCIS  ASSESSMENT & PLAN:  Ductal carcinoma in situ (DCIS) of right breast This is a very pleasant 62 year old postmenopausal female patient with newly diagnosed right breast DCIS, ER/PR positive s/p surgery and adj radiation here for follow up.  DCIS Post-Radiation Patient completed radiation therapy. Discussed the risks/benefits of Anastrozole vs Tamoxifen. Patient has concerns about bone density loss and prefers Tamoxifen. -Start Tamoxifen, to be dispensed to [facility] in [facility]. -Check tolerance and side effects in 3-4 months at survivorship clinic visit.  General Health Maintenance / Followup Plans -Schedule survivorship clinic visit in 3-4 months. -Continue annual mammograms at the breast center. -Alternate follow-up visits between current provider and Dr. Donell Beers every 6 months.   No orders of the defined types were placed in this encounter.    HISTORY OF PRESENTING ILLNESS:  DELILAH Maynard 62 y.o. female is here because of DCIS right breast.  Oncology History  Ductal carcinoma in situ (DCIS) of right breast  02/10/2022 Mammogram   Stable probably benign right breast calcs noted in Jan 2024. 6 month follow up mammogram showed increasing indeterminate calcs right breast.   08/27/2022 Pathology Results   Right breast needle core biopsy showed DCIS, solid type, intermediate to high nuclear grade, necrosis present, calcifications present, prognostic showed ER 100% positive strong staining PR 2% positive strong staining   09/08/2022 Initial  Diagnosis   Ductal carcinoma in situ (DCIS) of right breast   09/28/2022 Cancer Staging   Staging form: Breast, AJCC 8th Edition - Clinical: Stage 0 (cTis (DCIS), cN0, cM0, G2, ER+, PR+) - Signed by Rachel Moulds, MD on 09/28/2022 Stage prefix: Initial diagnosis Histologic grading system: 3 grade system    Genetic Testing   [facility] CancerNext Panel+RNA was Negative. Report date is 10/16/2022.   The CancerNext gene panel offered by [employer] includes sequencing, rearrangement analysis, and RNA analysis for the following 34 genes:   APC, ATM, AXIN2, BARD1, BMPR1A, BRCA1, BRCA2, BRIP1, CDH1, CDK4, CDKN2A, CHEK2, DICER1, HOXB13, EPCAM, GREM1, MLH1, MSH2, MSH3, MSH6, MUTYH, NF1, NTHL1, PALB2, PMS2, POLD1, POLE, PTEN, RAD51C, RAD51D, SMAD4, SMARCA4, STK11, and TP53.     The patient, with a history of radiation therapy, presents for a follow-up consultation to discuss the next steps in her treatment plan. She expresses concerns about potential side effects of the proposed medications, particularly bone density loss. She has been researching the two options, anastrozole and tamoxifen, and is leaning towards tamoxifen due to its potential for bone density gain. The patient also mentions a history of total hysterectomy and is not concerned about potential endometrial effects. She is also mindful of potential weight gain but is committed to maintaining a healthy lifestyle. The patient has plans for travel in the near future and is keen to maintain her health for these activities. No other pertinent ROS.  Age at first child birth: 14 Age at first cycle: 12/13 No use of HRT OCP use for 4 yrs FH of breast cancer, sister had BC at 62. History of breast feeding:  No Alcohol : occasions. Retired Runner, broadcasting/film/video,   MEDICAL HISTORY:  Past Medical History:  Diagnosis Date  Alternating exotropia    Anxiety    Bilateral eye strain    Breast cancer (HCC) 08/27/2022   Depression    Dissociated vertical  deviation    Family history of breast cancer    Hypertension     SURGICAL HISTORY: Past Surgical History:  Procedure Laterality Date   BREAST BIOPSY Right 08/27/2022   MM RT BREAST BX W LOC DEV 1ST LESION IMAGE BX SPEC STEREO GUIDE 08/27/2022 [facility]   BREAST BIOPSY  09/29/2022   MM RT RADIOACTIVE SEED LOC MAMMO GUIDE 09/29/2022 [facility]   BREAST LUMPECTOMY WITH RADIOACTIVE SEED LOCALIZATION Right 09/30/2022   Procedure: RIGHT BREAST LUMPECTOMY WITH RADIOACTIVE SEED LOCALIZATION;  Surgeon: Almond Lint, MD;  Location: [facility];  Service: General;  Laterality: Right;   STRABISMUS SURGERY Bilateral 11/17/2015   SUPRACERVICAL ABDOMINAL HYSTERECTOMY      SOCIAL HISTORY: Social History   Socioeconomic History   Marital status: Married    Spouse name: Not on file   Number of children: Not on file   Years of education: Not on file   Highest education level: Not on file  Occupational History   Not on file  Tobacco Use   Smoking status: Never   Smokeless tobacco: Never  Vaping Use   Vaping status: Never Used  Substance and Sexual Activity   Alcohol use: Yes    Comment: occ   Drug use: Never   Sexual activity: Not on file  Other Topics Concern   Not on file  Social History Narrative   Not on file   Social Determinants of Health   Financial Resource Strain: Not on file  Food Insecurity: No Food Insecurity (09/09/2022)   Hunger Vital Sign    Worried About Running Out of Food in the Last Year: Never true    Ran Out of Food in the Last Year: Never true  Transportation Needs: No Transportation Needs (09/09/2022)   PRAPARE - Administrator, Civil Service (Medical): No    Lack of Transportation (Non-Medical): No  Physical Activity: Not on file  Stress: Not on file  Social Connections: Not on file  Intimate Partner Violence: Not At Risk (09/09/2022)   Humiliation, Afraid, Rape, and Kick questionnaire    Fear of Current or Ex-Partner:  No    Emotionally Abused: No    Physically Abused: No    Sexually Abused: No    FAMILY HISTORY: Family History  Problem Relation Age of Onset   Breast cancer Sister 14   BRCA 1/2 Neg Hx     ALLERGIES:  is allergic to morphine.  MEDICATIONS:  Current Outpatient Medications  Medication Sig Dispense Refill   tamoxifen (NOLVADEX) 20 MG tablet Take 1 tablet (20 mg total) by mouth daily. 90 tablet 3   ALPRAZolam (XANAX) 0.25 MG tablet Take 0.25 mg by mouth 2 (two) times daily as needed.     ascorbic acid (VITAMIN C) 1000 MG tablet Take by mouth.     Cholecalciferol (VITAMIN D-1000 MAX ST) 25 MCG (1000 UT) tablet Take by mouth.     escitalopram (LEXAPRO) 20 MG tablet Take 20 mg by mouth daily.     fluticasone (FLONASE) 50 MCG/ACT nasal spray Use 2 squirts to each nostril 2 times daily for 7-10 days, then 2 squirts in each nare daily     losartan (COZAAR) 25 MG tablet Take by mouth.     oxyCODONE (OXY IR/ROXICODONE) 5 MG immediate release tablet Take 1 tablet (5 mg  total) by mouth every 6 (six) hours as needed for severe pain. 8 tablet 0   traZODone (DESYREL) 50 MG tablet Take 50-100 mg by mouth at bedtime as needed.     Zinc 10 MG LOZG Zinc     No current facility-administered medications for this visit.     PHYSICAL EXAMINATION: ECOG PERFORMANCE STATUS: 0 - Asymptomatic  Vitals:   01/05/23 1206  BP: (!) 148/86  Pulse: 76  Resp: 16  Temp: 97.9 F (36.6 C)  SpO2: 100%   Filed Weights   01/05/23 1206  Weight: 158 lb (71.7 kg)    GENERAL:alert, no distress and comfortable Rest of the physical exam deferred in lieu of counseling  LABORATORY DATA:  I have reviewed the data as listed Lab Results  Component Value Date   WBC 7.1 09/28/2022   HGB 13.9 09/28/2022   HCT 42.3 09/28/2022   MCV 89.6 09/28/2022   PLT 248 09/28/2022     Chemistry      Component Value Date/Time   NA 137 09/28/2022 1153   K 4.1 09/28/2022 1153   CL 106 09/28/2022 1153   CO2 24 09/28/2022  1153   BUN 21 09/28/2022 1153   CREATININE 0.93 09/28/2022 1153      Component Value Date/Time   CALCIUM 9.4 09/28/2022 1153   ALKPHOS 55 09/28/2022 1153   AST 22 09/28/2022 1153   ALT 12 09/28/2022 1153   BILITOT 0.5 09/28/2022 1153       RADIOGRAPHIC STUDIES: I have personally reviewed the radiological images as listed and agreed with the findings in the report. No results found.  All questions were answered. The patient knows to call the clinic with any problems, questions or concerns. I spent 20 minutes in the care of this patient including H and P, review of records, counseling and coordination of care.     Rachel Moulds, MD 01/05/2023 1:24 PM

## 2023-01-25 ENCOUNTER — Ambulatory Visit
Admission: RE | Admit: 2023-01-25 | Discharge: 2023-01-25 | Disposition: A | Discharge status: Home / Self Care | Payer: 59 | Source: Ambulatory Visit | Attending: Radiation Oncology | Admitting: Radiation Oncology

## 2023-01-25 NOTE — Progress Notes (Signed)
  Radiation Oncology         [phone] ________________________________  Name: April Maynard MRN: [id number]  Date of Service: 01/25/2023  DOB: Sep 12, 1960  Post Treatment Telephone Note  Diagnosis:  Intermediate to High Grade ER/PR positive DCIS of the right breast.   (as documented in provider EOT note)  The patient was not available for call today. Voicemail left.  The patient was encouraged to avoid sun exposure in the area of prior treatment for up to one year following radiation with either sunscreen or by the style of clothing worn in the sun.  The patient has scheduled follow up with her medical oncologist Dr. Al Pimple for ongoing surveillance, and was encouraged to call if she develops concerns or questions regarding radiation.    Ruel Favors, LPN

## 2023-04-08 IMAGING — MG DIGITAL SCREENING BILAT W/ CAD
5 series · 5 of 5 positions shown · non-contrast
Comparison: Previous exam(s).

CLINICAL DATA: Screening.

EXAM:
DIGITAL SCREENING BILATERAL MAMMOGRAM WITH CAD
TECHNIQUE: Bilateral screening digital craniocaudal and mediolateral oblique
mammograms were obtained. The images were evaluated with
computer-aided detection.

[R CC]
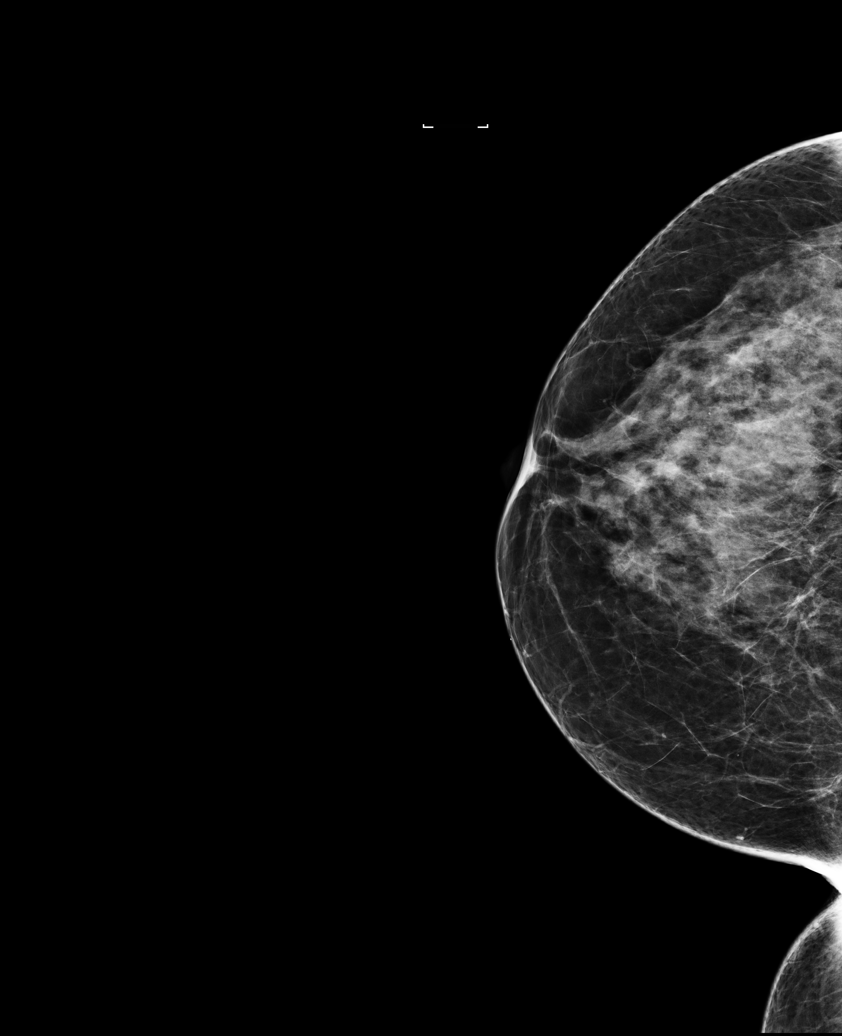

[L MLO]
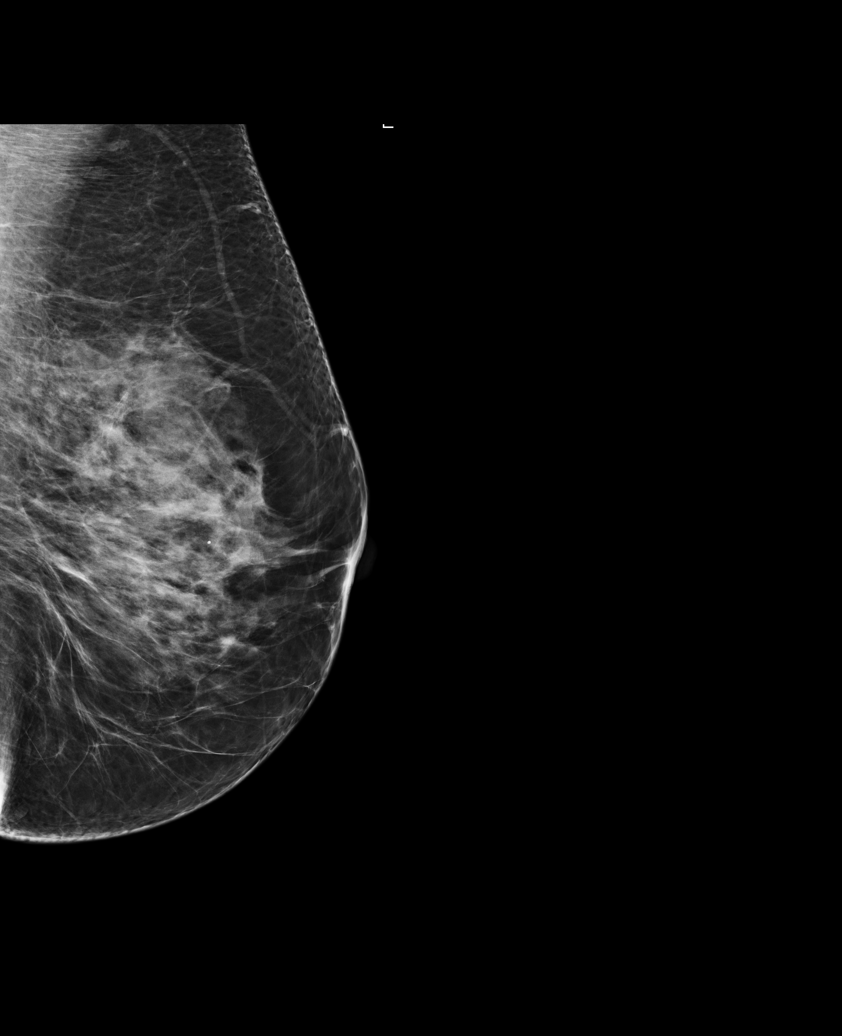

[L CC (1 of 2)]
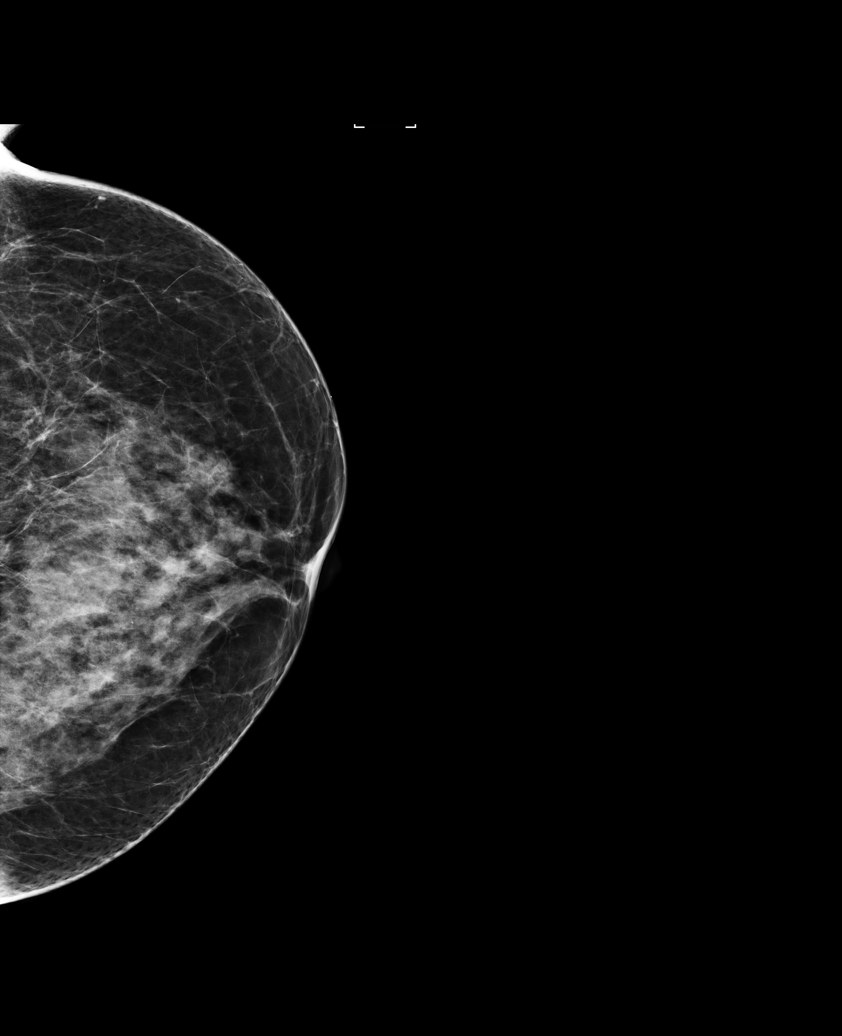

[R MLO]
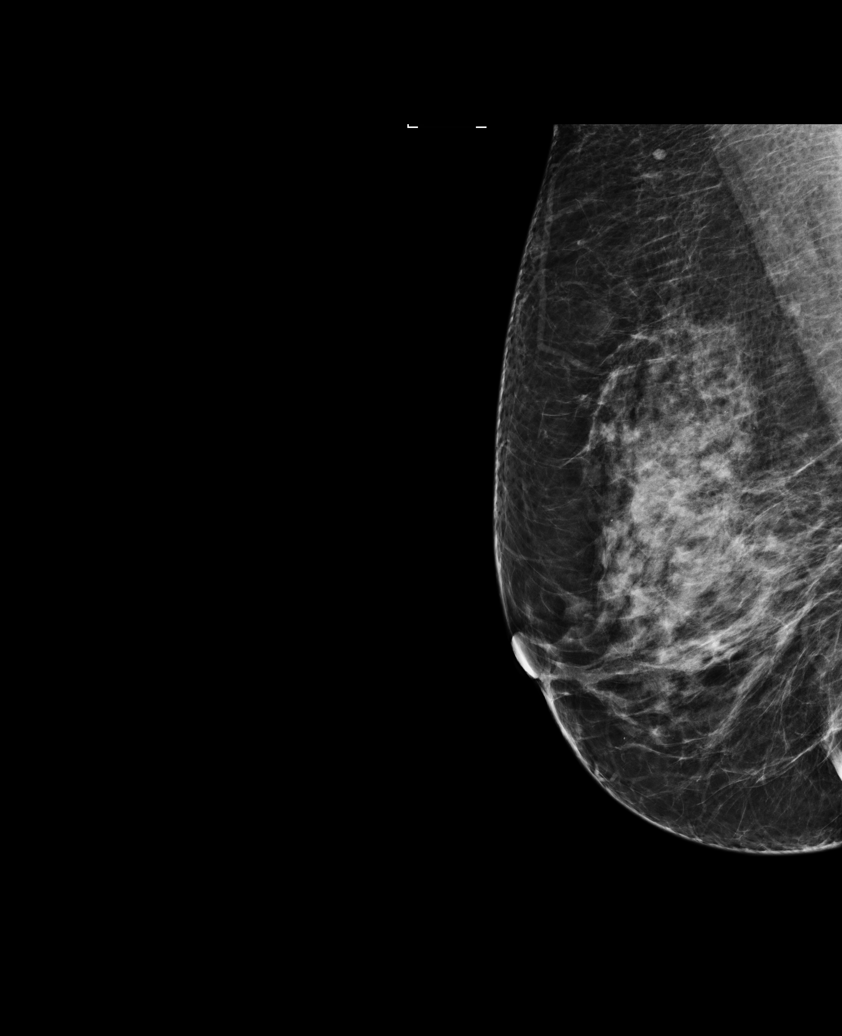

[L CC (2 of 2)]
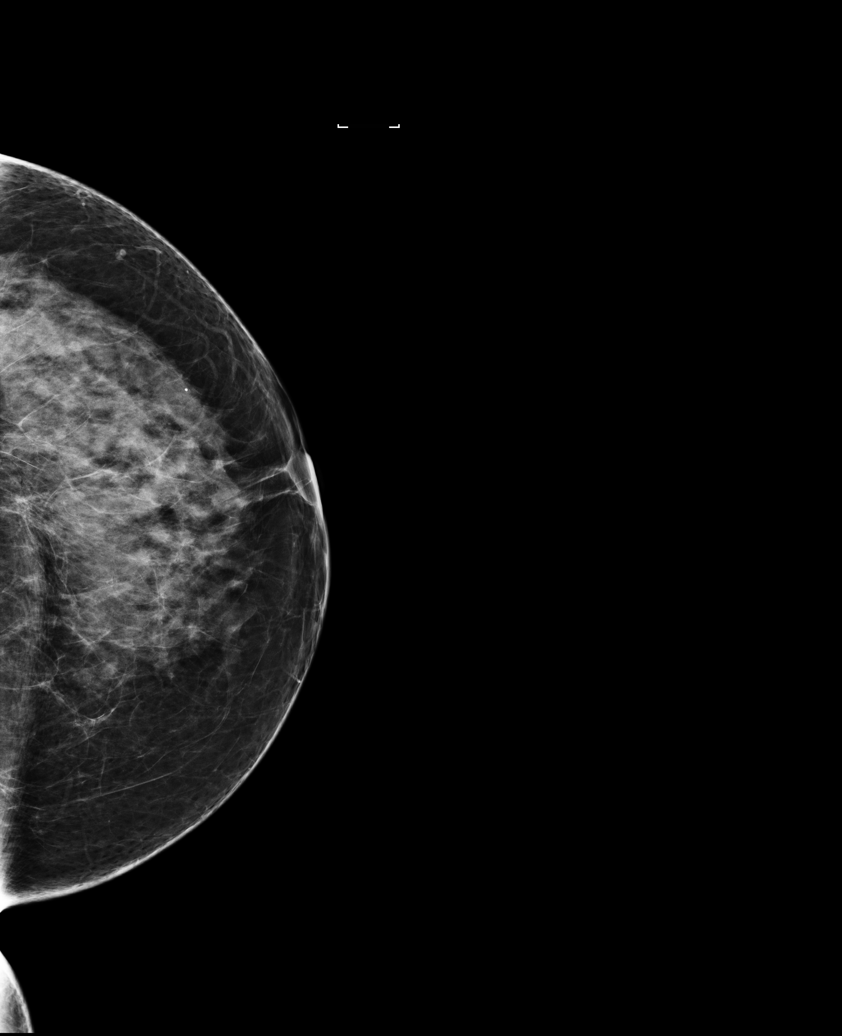

[5 of 5 positions shown; findings below may reference images not displayed]

ACR Breast Density Category c: The breast tissue is heterogeneously
dense, which may obscure small masses.
FINDINGS: There are no findings suspicious for malignancy. The images were
evaluated with computer-aided detection.
IMPRESSION: No mammographic evidence of malignancy. A result letter of this
screening mammogram will be mailed directly to the patient.

RECOMMENDATION:
Screening mammogram in one year. (Code:WI-W-Z4A)

BI-RADS CATEGORY  1: Negative.

## 2023-05-06 ENCOUNTER — Telehealth: Payer: Self-pay | Admitting: *Deleted

## 2023-05-06 ENCOUNTER — Other Ambulatory Visit: Payer: Self-pay

## 2023-05-06 ENCOUNTER — Encounter: Payer: Self-pay | Admitting: Adult Health

## 2023-05-06 ENCOUNTER — Telehealth: Payer: Self-pay | Admitting: Hematology and Oncology

## 2023-05-06 ENCOUNTER — Inpatient Hospital Stay: Payer: 59 | Attending: Adult Health | Admitting: Adult Health

## 2023-05-06 VITALS — BP 128/76 | HR 78 | Temp 98.1°F | Resp 16 | Ht 67.0 in | Wt 160.8 lb

## 2023-05-06 DIAGNOSIS — R5383 Other fatigue: Secondary | ICD-10-CM | POA: Diagnosis not present

## 2023-05-06 DIAGNOSIS — K5900 Constipation, unspecified: Secondary | ICD-10-CM | POA: Insufficient documentation

## 2023-05-06 DIAGNOSIS — Z803 Family history of malignant neoplasm of breast: Secondary | ICD-10-CM | POA: Insufficient documentation

## 2023-05-06 DIAGNOSIS — Z885 Allergy status to narcotic agent status: Secondary | ICD-10-CM | POA: Diagnosis not present

## 2023-05-06 DIAGNOSIS — Z923 Personal history of irradiation: Secondary | ICD-10-CM | POA: Diagnosis not present

## 2023-05-06 DIAGNOSIS — Z90710 Acquired absence of both cervix and uterus: Secondary | ICD-10-CM | POA: Insufficient documentation

## 2023-05-06 DIAGNOSIS — N7689 Other specified inflammation of vagina and vulva: Secondary | ICD-10-CM | POA: Insufficient documentation

## 2023-05-06 DIAGNOSIS — Z79899 Other long term (current) drug therapy: Secondary | ICD-10-CM | POA: Diagnosis not present

## 2023-05-06 DIAGNOSIS — I10 Essential (primary) hypertension: Secondary | ICD-10-CM | POA: Insufficient documentation

## 2023-05-06 DIAGNOSIS — D0511 Intraductal carcinoma in situ of right breast: Secondary | ICD-10-CM | POA: Insufficient documentation

## 2023-05-06 NOTE — Telephone Encounter (Signed)
 This RN was notified by [employer]- per her call to schedule that pt was asking if the tamoxifen could cause concentration changes and brain fog.  This RN returned call to pt obtained identified VM- detailed message left stating above symptoms could be related - asked her to return call to discuss further.

## 2023-05-06 NOTE — Progress Notes (Signed)
 SURVIVORSHIP VISIT:  BRIEF ONCOLOGIC HISTORY:  Oncology History  Ductal carcinoma in situ (DCIS) of right breast  02/10/2022 Mammogram   Stable probably benign right breast calcs noted in Jan 2024. 6 month follow up mammogram showed increasing indeterminate calcs right breast.   08/27/2022 Pathology Results   Right breast needle core biopsy showed DCIS, solid type, intermediate to high nuclear grade, necrosis present, calcifications present, prognostic showed ER 100% positive strong staining PR 2% positive strong staining   09/28/2022 Cancer Staging   Staging form: Breast, AJCC 8th Edition - Clinical: Stage 0 (cTis (DCIS), cN0, cM0, G2, ER+, PR+) - Signed by Rachel Moulds, MD on 09/28/2022 Stage prefix: Initial diagnosis Histologic grading system: 3 grade system    Genetic Testing   [facility] CancerNext Panel+RNA was Negative. Report date is 10/16/2022.   The CancerNext gene panel offered by [employer] includes sequencing, rearrangement analysis, and RNA analysis for the following 34 genes:   APC, ATM, AXIN2, BARD1, BMPR1A, BRCA1, BRCA2, BRIP1, CDH1, CDK4, CDKN2A, CHEK2, DICER1, HOXB13, EPCAM, GREM1, MLH1, MSH2, MSH3, MSH6, MUTYH, NF1, NTHL1, PALB2, PMS2, POLD1, POLE, PTEN, RAD51C, RAD51D, SMAD4, SMARCA4, STK11, and TP53.    09/30/2022 Surgery   Right Breast Lumpectomy: DCIS, 1.2cm    11/29/2022 - 12/24/2022 Radiation Therapy   Plan Name: Breast_R Site: Breast, Right Technique: 3D Mode: Photon Dose Per Fraction: 2.66 Gy Prescribed Dose (Delivered / Prescribed): 42.56 Gy / 42.56 Gy Prescribed Fxs (Delivered / Prescribed): 16 / 16   Plan Name: Breast_R_Bst Site: Breast, Right Technique: 3D Mode: Photon Dose Per Fraction: 2 Gy Prescribed Dose (Delivered / Prescribed): 8 Gy / 8 Gy Prescribed Fxs (Delivered / Prescribed): 4 / 4   01/2023 -  Anti-estrogen oral therapy   Tamoxifen x 5 years     INTERVAL HISTORY:  Discussed the use of AI scribe software for clinical note  transcription with the patient, who gave verbal consent to proceed.  April Maynard 63 y.o. female with a history of noninvasive breast cancer, hypertension, and a total hysterectomy, presents with constipation and tiredness. She recently started taking her medication at night and has noticed a change in her symptoms, but it is unclear if this is due to the timing of the medication. She denies any significant hot flashes, vaginal discharge, or yeast infections, but does report vaginal dryness, which she attributes to her previous hysterectomy.  The patient is also concerned about potential side effects of tamoxifen, particularly stroke and endometrial and uterine cancers, due to a family history of lung cancer. She is currently managing her hypertension with losartan and is making efforts to maintain a healthy lifestyle, including walking and eating a diet rich in fresh vegetables and lean meats. She does admit to occasionally indulging in pies and ice cream.  The patient also reports a history of skin cancer and plans to follow up with a dermatologist for a suspicious skin lesion. She is up to date with her vaccinations and is due for a diagnostic 3D mammogram in July.  REVIEW OF SYSTEMS:  Review of Systems  Constitutional:  Positive for fatigue. Negative for appetite change, chills, fever and unexpected weight change.  HENT:   Negative for hearing loss, lump/mass and trouble swallowing.   Eyes:  Negative for eye problems and icterus.  Respiratory:  Negative for chest tightness, cough and shortness of breath.   Cardiovascular:  Negative for chest pain, leg swelling and palpitations.  Gastrointestinal:  Negative for abdominal distention, abdominal pain, constipation, diarrhea, nausea and vomiting.  Endocrine: Negative for hot flashes.  Genitourinary:  Negative for difficulty urinating.   Musculoskeletal:  Negative for arthralgias.  Skin:  Negative for itching and rash.  Neurological:  Negative for  dizziness, extremity weakness, headaches and numbness.  Hematological:  Negative for adenopathy. Does not bruise/bleed easily.  Psychiatric/Behavioral:  Negative for depression. The patient is not nervous/anxious.   Breast: Denies any new nodularity, masses, tenderness, nipple changes, or nipple discharge.       PAST MEDICAL/SURGICAL HISTORY:  Past Medical History:  Diagnosis Date   Alternating exotropia    Anxiety    Bilateral eye strain    Breast cancer (HCC) 08/27/2022   Depression    Dissociated vertical deviation    Family history of breast cancer    Hypertension    Past Surgical History:  Procedure Laterality Date   BREAST BIOPSY Right 08/27/2022   MM RT BREAST BX W LOC DEV 1ST LESION IMAGE BX SPEC STEREO GUIDE 08/27/2022 [facility]   BREAST BIOPSY  09/29/2022   MM RT RADIOACTIVE SEED LOC MAMMO GUIDE 09/29/2022 [facility]   BREAST LUMPECTOMY WITH RADIOACTIVE SEED LOCALIZATION Right 09/30/2022   Procedure: RIGHT BREAST LUMPECTOMY WITH RADIOACTIVE SEED LOCALIZATION;  Surgeon: Almond Lint, MD;  Location: [facility];  Service: General;  Laterality: Right;   STRABISMUS SURGERY Bilateral 11/17/2015   SUPRACERVICAL ABDOMINAL HYSTERECTOMY       ALLERGIES:  Allergies  Allergen Reactions   Morphine Swelling     CURRENT MEDICATIONS:  Outpatient Encounter Medications as of 05/06/2023  Medication Sig   ALPRAZolam (XANAX) 0.25 MG tablet Take 0.25 mg by mouth 2 (two) times daily as needed.   ascorbic acid (VITAMIN C) 1000 MG tablet Take by mouth.   Cholecalciferol (VITAMIN D-1000 MAX ST) 25 MCG (1000 UT) tablet Take by mouth.   escitalopram (LEXAPRO) 20 MG tablet Take 20 mg by mouth daily.   fluticasone (FLONASE) 50 MCG/ACT nasal spray Use 2 squirts to each nostril 2 times daily for 7-10 days, then 2 squirts in each nare daily   losartan (COZAAR) 25 MG tablet Take by mouth.   oxyCODONE (OXY IR/ROXICODONE) 5 MG immediate release tablet Take 1 tablet  (5 mg total) by mouth every 6 (six) hours as needed for severe pain.   tamoxifen (NOLVADEX) 20 MG tablet Take 1 tablet (20 mg total) by mouth daily.   traZODone (DESYREL) 50 MG tablet Take 50-100 mg by mouth at bedtime as needed.   Zinc 10 MG LOZG Zinc   No facility-administered encounter medications on file as of 05/06/2023.     ONCOLOGIC FAMILY HISTORY:  Family History  Problem Relation Age of Onset   Breast cancer Sister 78   BRCA 1/2 Neg Hx      SOCIAL HISTORY:  Social History   Socioeconomic History   Marital status: Married    Spouse name: Not on file   Number of children: Not on file   Years of education: Not on file   Highest education level: Not on file  Occupational History   Not on file  Tobacco Use   Smoking status: Never   Smokeless tobacco: Never  Vaping Use   Vaping status: Never Used  Substance and Sexual Activity   Alcohol use: Yes    Comment: occ   Drug use: Never   Sexual activity: Not on file  Other Topics Concern   Not on file  Social History Narrative   Not on file   Social Drivers of Health  Financial Resource Strain: Not on file  Food Insecurity: No Food Insecurity (09/09/2022)   Hunger Vital Sign    Worried About Running Out of Food in the Last Year: Never true    Ran Out of Food in the Last Year: Never true  Transportation Needs: No Transportation Needs (09/09/2022)   PRAPARE - Administrator, Civil Service (Medical): No    Lack of Transportation (Non-Medical): No  Physical Activity: Not on file  Stress: Not on file  Social Connections: Not on file  Intimate Partner Violence: Not At Risk (09/09/2022)   Humiliation, Afraid, Rape, and Kick questionnaire    Fear of Current or Ex-Partner: No    Emotionally Abused: No    Physically Abused: No    Sexually Abused: No     OBSERVATIONS/OBJECTIVE:  BP 128/76 (BP Location: Left Arm, Patient Position: Sitting)   Pulse 78   Temp 98.1 F (36.7 C) (Temporal)   Resp 16   Ht 5\' 7"   (1.702 m)   Wt 160 lb 12.8 oz (72.9 kg)   SpO2 100%   BMI 25.18 kg/m  GENERAL: Patient is a well appearing female in no acute distress HEENT:  Sclerae anicteric.  Oropharynx clear and moist. No ulcerations or evidence of oropharyngeal candidiasis. Neck is supple.  NODES:  No cervical, supraclavicular, or axillary lymphadenopathy palpated.  BREAST EXAM:  right breast s/p lumpectomy and radiation, no sign of local recurrence, left breast benign LUNGS:  Clear to auscultation bilaterally.  No wheezes or rhonchi. HEART:  Regular rate and rhythm. No murmur appreciated. ABDOMEN:  Soft, nontender.  Positive, normoactive bowel sounds. No organomegaly palpated. MSK:  No focal spinal tenderness to palpation. Full range of motion bilaterally in the upper extremities. EXTREMITIES:  No peripheral edema.   SKIN:  Clear with no obvious rashes or skin changes. No nail dyscrasia. NEURO:  Nonfocal. Well oriented.  Appropriate affect.   LABORATORY DATA:  None for this visit.  DIAGNOSTIC IMAGING:  None for this visit.      ASSESSMENT AND PLAN:  April Maynard is a pleasant 63 y.o. female with Stage 0 right breast DCIS, ER+/PR+, diagnosed in 09/2022, treated with lumpectomy, adjuvant radiation therapy, and anti-estrogen therapy with Tamoxifen beginning in 01/2023.  She presents to the Survivorship Clinic for our initial meeting and routine follow-up post-completion of treatment for breast cancer.    1. Stage 0 right breast cancer:  Ms. Pates is continuing to recover from definitive treatment for breast cancer. She will follow-up with her medical oncologist, Dr.  Al Pimple in 6 months with history and physical exam per surveillance protocol.  She will continue her anti-estrogen therapy with Tamoxifen. Thus far, she is tolerating the Tamoxifen well, with minimal side effects. Her mammogram is due 08/2023; orders placed today.   Today, a comprehensive survivorship care plan and treatment summary was reviewed with the  patient today detailing her breast cancer diagnosis, treatment course, potential late/long-term effects of treatment, appropriate follow-up care with recommendations for the future, and patient education resources.  A copy of this summary, along with a letter will be sent to the patient's primary care provider via mail/fax/In Basket message after today's visit.    2. Bone health:  She was given education on specific activities to promote bone health.  3. Cancer screening:  Due to Ms. Nola history and her age, she should receive screening for skin cancers, colon cancer, and gynecologic cancers.  The information and recommendations are listed on the patient's comprehensive care  plan/treatment summary and were reviewed in detail with the patient.    4. Health maintenance and wellness promotion: Ms. Costin was encouraged to consume 5-7 servings of fruits and vegetables per day. We reviewed the "Nutrition Rainbow" handout.  She was also encouraged to engage in moderate to vigorous exercise for 30 minutes per day most days of the week.  She was instructed to limit her alcohol consumption and continue to abstain from tobacco use.     5. Support services/counseling: It is not uncommon for this period of the patient's cancer care trajectory to be one of many emotions and stressors.   She was given information regarding our available services and encouraged to contact me with any questions or for help enrolling in any of our support group/programs.    Follow up instructions:    -Return to cancer center in 6 months for f/u with Dr. Al Pimple  -Mammogram due in 08/2023 -She is welcome to return back to the Survivorship Clinic at any time; no additional follow-up needed at this time.  -Consider referral back to survivorship as a long-term survivor for continued surveillance  The patient was provided an opportunity to ask questions and all were answered. The patient agreed with the plan and demonstrated an  understanding of the instructions.   Total encounter time:45 minutes*in face-to-face visit time, chart review, lab review, care coordination, order entry, and documentation of the encounter time.    Lillard Anes, NP 05/09/23 3:28 PM Medical Oncology and Hematology [facility] [address] [place], [place] [address] Tel. [phone]    Fax. [phone]  *Total Encounter Time as defined by the Centers for Medicare and Medicaid Services includes, in addition to the face-to-face time of a patient visit (documented in the note above) non-face-to-face time: obtaining and reviewing outside history, ordering and reviewing medications, tests or procedures, care coordination (communications with other health care professionals or caregivers) and documentation in the medical record.

## 2023-05-06 NOTE — Telephone Encounter (Signed)
 Spoke with pt about scheduled appt's date and time

## 2023-05-10 ENCOUNTER — Telehealth: Payer: Self-pay

## 2023-05-10 NOTE — Progress Notes (Unsigned)
 [facility] CONSULT NOTE  Patient Care Team: Joycelyn Rua, MD as PCP - General (Family Medicine) Almond Lint, MD as Consulting Physician (General Surgery) Rachel Moulds, MD as Consulting Physician (Hematology and Oncology) Dorothy Puffer, MD as Consulting Physician (Radiation Oncology)  CHIEF COMPLAINTS/PURPOSE OF CONSULTATION:  DCIS  ASSESSMENT & PLAN:  No problem-specific Assessment & Plan notes found for this encounter.    No orders of the defined types were placed in this encounter.    HISTORY OF PRESENTING ILLNESS:  April Maynard 63 y.o. female is here because of DCIS right breast.  Oncology History  Ductal carcinoma in situ (DCIS) of right breast  02/10/2022 Mammogram   Stable probably benign right breast calcs noted in Jan 2024. 6 month follow up mammogram showed increasing indeterminate calcs right breast.   08/27/2022 Pathology Results   Right breast needle core biopsy showed DCIS, solid type, intermediate to high nuclear grade, necrosis present, calcifications present, prognostic showed ER 100% positive strong staining PR 2% positive strong staining   09/28/2022 Cancer Staging   Staging form: Breast, AJCC 8th Edition - Clinical: Stage 0 (cTis (DCIS), cN0, cM0, G2, ER+, PR+) - Signed by Rachel Moulds, MD on 09/28/2022 Stage prefix: Initial diagnosis Histologic grading system: 3 grade system    Genetic Testing   [facility] CancerNext Panel+RNA was Negative. Report date is 10/16/2022.   The CancerNext gene panel offered by [employer] includes sequencing, rearrangement analysis, and RNA analysis for the following 34 genes:   APC, ATM, AXIN2, BARD1, BMPR1A, BRCA1, BRCA2, BRIP1, CDH1, CDK4, CDKN2A, CHEK2, DICER1, HOXB13, EPCAM, GREM1, MLH1, MSH2, MSH3, MSH6, MUTYH, NF1, NTHL1, PALB2, PMS2, POLD1, POLE, PTEN, RAD51C, RAD51D, SMAD4, SMARCA4, STK11, and TP53.    09/30/2022 Surgery   Right Breast Lumpectomy: DCIS, 1.2cm    11/29/2022 - 12/24/2022 Radiation  Therapy   Plan Name: Breast_R Site: Breast, Right Technique: 3D Mode: Photon Dose Per Fraction: 2.66 Gy Prescribed Dose (Delivered / Prescribed): 42.56 Gy / 42.56 Gy Prescribed Fxs (Delivered / Prescribed): 16 / 16   Plan Name: Breast_R_Bst Site: Breast, Right Technique: 3D Mode: Photon Dose Per Fraction: 2 Gy Prescribed Dose (Delivered / Prescribed): 8 Gy / 8 Gy Prescribed Fxs (Delivered / Prescribed): 4 / 4   01/2023 -  Anti-estrogen oral therapy   Tamoxifen x 5 years     Age at first child birth: 53 Age at first cycle: 12/13 No use of HRT OCP use for 4 yrs FH of breast cancer, sister had BC at 51. History of breast feeding:  No Alcohol : occasions. Retired Runner, broadcasting/film/video,   Interval History    MEDICAL HISTORY:  Past Medical History:  Diagnosis Date   Alternating exotropia    Anxiety    Bilateral eye strain    Breast cancer (HCC) 08/27/2022   Depression    Dissociated vertical deviation    Family history of breast cancer    Hypertension     SURGICAL HISTORY: Past Surgical History:  Procedure Laterality Date   BREAST BIOPSY Right 08/27/2022   MM RT BREAST BX W LOC DEV 1ST LESION IMAGE BX SPEC STEREO GUIDE 08/27/2022 [facility]   BREAST BIOPSY  09/29/2022   MM RT RADIOACTIVE SEED LOC MAMMO GUIDE 09/29/2022 [facility]   BREAST LUMPECTOMY WITH RADIOACTIVE SEED LOCALIZATION Right 09/30/2022   Procedure: RIGHT BREAST LUMPECTOMY WITH RADIOACTIVE SEED LOCALIZATION;  Surgeon: Almond Lint, MD;  Location: [facility];  Service: General;  Laterality: Right;   STRABISMUS SURGERY Bilateral 11/17/2015  SUPRACERVICAL ABDOMINAL HYSTERECTOMY      SOCIAL HISTORY: Social History   Socioeconomic History   Marital status: Married    Spouse name: Not on file   Number of children: Not on file   Years of education: Not on file   Highest education level: Not on file  Occupational History   Not on file  Tobacco Use   Smoking status: Never    Smokeless tobacco: Never  Vaping Use   Vaping status: Never Used  Substance and Sexual Activity   Alcohol use: Yes    Comment: occ   Drug use: Never   Sexual activity: Not on file  Other Topics Concern   Not on file  Social History Narrative   Not on file   Social Drivers of Health   Financial Resource Strain: Not on file  Food Insecurity: No Food Insecurity (09/09/2022)   Hunger Vital Sign    Worried About Running Out of Food in the Last Year: Never true    Ran Out of Food in the Last Year: Never true  Transportation Needs: No Transportation Needs (09/09/2022)   PRAPARE - Administrator, Civil Service (Medical): No    Lack of Transportation (Non-Medical): No  Physical Activity: Not on file  Stress: Not on file  Social Connections: Not on file  Intimate Partner Violence: Not At Risk (09/09/2022)   Humiliation, Afraid, Rape, and Kick questionnaire    Fear of Current or Ex-Partner: No    Emotionally Abused: No    Physically Abused: No    Sexually Abused: No    FAMILY HISTORY: Family History  Problem Relation Age of Onset   Breast cancer Sister 87   BRCA 1/2 Neg Hx     ALLERGIES:  is allergic to morphine.  MEDICATIONS:  Current Outpatient Medications  Medication Sig Dispense Refill   ALPRAZolam (XANAX) 0.25 MG tablet Take 0.25 mg by mouth 2 (two) times daily as needed.     ascorbic acid (VITAMIN C) 1000 MG tablet Take by mouth.     Cholecalciferol (VITAMIN D-1000 MAX ST) 25 MCG (1000 UT) tablet Take by mouth.     escitalopram (LEXAPRO) 20 MG tablet Take 20 mg by mouth daily.     fluticasone (FLONASE) 50 MCG/ACT nasal spray Use 2 squirts to each nostril 2 times daily for 7-10 days, then 2 squirts in each nare daily     losartan (COZAAR) 25 MG tablet Take by mouth.     oxyCODONE (OXY IR/ROXICODONE) 5 MG immediate release tablet Take 1 tablet (5 mg total) by mouth every 6 (six) hours as needed for severe pain. 8 tablet 0   tamoxifen (NOLVADEX) 20 MG tablet Take 1  tablet (20 mg total) by mouth daily. 90 tablet 3   traZODone (DESYREL) 50 MG tablet Take 50-100 mg by mouth at bedtime as needed.     Zinc 10 MG LOZG Zinc     No current facility-administered medications for this visit.     PHYSICAL EXAMINATION: ECOG PERFORMANCE STATUS: 0 - Asymptomatic  There were no vitals filed for this visit.  There were no vitals filed for this visit.   GENERAL:alert, no distress and comfortable Rest of the physical exam deferred in lieu of counseling  LABORATORY DATA:  I have reviewed the data as listed Lab Results  Component Value Date   WBC 7.1 09/28/2022   HGB 13.9 09/28/2022   HCT 42.3 09/28/2022   MCV 89.6 09/28/2022   PLT 248 09/28/2022  Chemistry      Component Value Date/Time   NA 137 09/28/2022 1153   K 4.1 09/28/2022 1153   CL 106 09/28/2022 1153   CO2 24 09/28/2022 1153   BUN 21 09/28/2022 1153   CREATININE 0.93 09/28/2022 1153      Component Value Date/Time   CALCIUM 9.4 09/28/2022 1153   ALKPHOS 55 09/28/2022 1153   AST 22 09/28/2022 1153   ALT 12 09/28/2022 1153   BILITOT 0.5 09/28/2022 1153       RADIOGRAPHIC STUDIES: I have personally reviewed the radiological images as listed and agreed with the findings in the report. No results found.  All questions were answered. The patient knows to call the clinic with any problems, questions or concerns. I spent 20 minutes in the care of this patient including H and P, review of records, counseling and coordination of care.     Rachel Moulds, MD 05/10/2023 8:06 AM

## 2023-05-10 NOTE — Telephone Encounter (Signed)
 Spoke with patient and confirmed appointment for 05/11/23

## 2023-05-11 ENCOUNTER — Inpatient Hospital Stay: Admitting: Hematology and Oncology

## 2023-05-11 VITALS — BP 144/78 | HR 79 | Temp 98.3°F | Resp 17 | Wt 159.8 lb

## 2023-05-11 DIAGNOSIS — D0511 Intraductal carcinoma in situ of right breast: Secondary | ICD-10-CM | POA: Diagnosis not present

## 2023-05-11 MED ORDER — TAMOXIFEN CITRATE 10 MG PO TABS: 5.0000 mg | ORAL_TABLET | Freq: Two times a day (BID) | ORAL | 0 refills | Status: DC

## 2023-05-11 NOTE — Assessment & Plan Note (Signed)
 This is a very pleasant 63 year old postmenopausal female patient with newly diagnosed right breast DCIS, ER/PR positive s/p surgery and adj radiation , now on adj tamoxifen who is here for follow up. 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 A lower dose of 5 mg has shown efficacy in maintaining cancer control with fewer side effects. - Discontinue 20 mg tamoxifen. - Prescribe 10 mg tamoxifen tablets to be cut in half for a 5 mg dose. - Advise trial of lower dose for one month, monitor cognitive symptoms. -- Advise to report condition in a month or sooner if no improvement.  Depression She is on escitalopram 20 mg with mild interaction with tamoxifen but is well-managed. Considering sertraline if cognitive symptoms persist post-tamoxifen adjustment. - Maintain escitalopram 20 mg dose. - Consider sertraline if cognitive symptoms persist after tamoxifen adjustment.  FU in 4 weeks, telephone visit.

## 2023-05-12 ENCOUNTER — Telehealth: Payer: Self-pay | Admitting: Hematology and Oncology

## 2023-05-12 NOTE — Telephone Encounter (Signed)
 Confirmed with pt scheduled appt date and time

## 2023-06-02 ENCOUNTER — Other Ambulatory Visit: Payer: Self-pay | Admitting: Hematology and Oncology

## 2023-06-08 ENCOUNTER — Telehealth: Payer: Self-pay | Admitting: Hematology and Oncology

## 2023-06-08 NOTE — Progress Notes (Deleted)
 I spoke to [facility]. She is doing so much on low dose tamoxifen  Energy is better, she is able to concentrate better. She is very thankful for the dose change. We discussed there is good data on low dose tamoxifen  and its efficacy in post menopausal women. Mammogram has been ordered for July 2025, she will contact breast center to have this scheduled.  Murleen Arms MD

## 2023-06-08 NOTE — Telephone Encounter (Signed)
 I spoke with patient. She is tolerating tamoxifen  5 mg extremely well We have discussed efficacy of low dose tamoxifen  based on TAM 01 study for DCIS. She has a mammogram order for August 02 2023, she was encouraged to call the breast center to have this scheduled. Once again, she is very thankful and come back to see us  in Sep as scheduled.  Luane Rochon

## 2023-06-09 ENCOUNTER — Inpatient Hospital Stay: Admitting: Hematology and Oncology

## 2023-06-09 DIAGNOSIS — D0511 Intraductal carcinoma in situ of right breast: Secondary | ICD-10-CM

## 2023-06-20 NOTE — Progress Notes (Signed)
Encounter cancelled  °

## 2023-08-24 ENCOUNTER — Encounter: Payer: Self-pay | Admitting: Hematology and Oncology

## 2023-08-25 ENCOUNTER — Ambulatory Visit
Admission: RE | Admit: 2023-08-25 | Discharge: 2023-08-25 | Disposition: A | Discharge status: Home / Self Care | Source: Ambulatory Visit | Attending: Adult Health | Admitting: Adult Health

## 2023-08-25 DIAGNOSIS — D0511 Intraductal carcinoma in situ of right breast: Secondary | ICD-10-CM

## 2023-09-17 ENCOUNTER — Encounter: Payer: Self-pay | Admitting: Hematology and Oncology

## 2023-09-19 ENCOUNTER — Telehealth: Payer: Self-pay

## 2023-09-19 NOTE — Telephone Encounter (Signed)
 Pt returned call from previous encounter. She states she is having memory issues that started with Tamoxifen . Despite lowering the dose, she is concerned the Tamoxifen  has caused memory problems and she also reports depression. She would like to discuss switching to Anastrozole. Pt's last DEXA shows total t-score of -1.7 and that was 05/10/20. Explained to pt that anastrozole can worsen this, and we would need to r/c since it has been 3 years. She is interested in moving her appt up from Sept 23 to sooner to discuss all of these concerns with Dr Loretha. Appt offered for 8/22 at 0845 and she accepted.

## 2023-09-23 ENCOUNTER — Inpatient Hospital Stay: Attending: Hematology and Oncology | Admitting: Hematology and Oncology

## 2023-09-23 VITALS — BP 121/62 | HR 72 | Temp 98.3°F | Resp 17 | Wt 160.6 lb

## 2023-09-23 DIAGNOSIS — Z803 Family history of malignant neoplasm of breast: Secondary | ICD-10-CM | POA: Diagnosis not present

## 2023-09-23 DIAGNOSIS — R413 Other amnesia: Secondary | ICD-10-CM | POA: Insufficient documentation

## 2023-09-23 DIAGNOSIS — F419 Anxiety disorder, unspecified: Secondary | ICD-10-CM | POA: Diagnosis not present

## 2023-09-23 DIAGNOSIS — Z79899 Other long term (current) drug therapy: Secondary | ICD-10-CM | POA: Diagnosis not present

## 2023-09-23 DIAGNOSIS — M8580 Other specified disorders of bone density and structure, unspecified site: Secondary | ICD-10-CM | POA: Insufficient documentation

## 2023-09-23 DIAGNOSIS — D0511 Intraductal carcinoma in situ of right breast: Secondary | ICD-10-CM | POA: Insufficient documentation

## 2023-09-23 DIAGNOSIS — Z170 Estrogen receptor positive status [ER+]: Secondary | ICD-10-CM | POA: Insufficient documentation

## 2023-09-23 DIAGNOSIS — F32A Depression, unspecified: Secondary | ICD-10-CM | POA: Insufficient documentation

## 2023-09-23 DIAGNOSIS — Z90710 Acquired absence of both cervix and uterus: Secondary | ICD-10-CM | POA: Insufficient documentation

## 2023-09-23 DIAGNOSIS — Z79811 Long term (current) use of aromatase inhibitors: Secondary | ICD-10-CM | POA: Insufficient documentation

## 2023-09-23 DIAGNOSIS — Z1721 Progesterone receptor positive status: Secondary | ICD-10-CM | POA: Diagnosis not present

## 2023-09-23 DIAGNOSIS — Z885 Allergy status to narcotic agent status: Secondary | ICD-10-CM | POA: Diagnosis not present

## 2023-09-23 DIAGNOSIS — Z79810 Long term (current) use of selective estrogen receptor modulators (SERMs): Secondary | ICD-10-CM | POA: Diagnosis not present

## 2023-09-23 DIAGNOSIS — Z923 Personal history of irradiation: Secondary | ICD-10-CM | POA: Diagnosis not present

## 2023-09-23 MED ORDER — EXEMESTANE 25 MG PO TABS: 25.0000 mg | ORAL_TABLET | Freq: Every day | ORAL | 1 refills | Status: DC

## 2023-09-23 NOTE — Assessment & Plan Note (Addendum)
 This is a very pleasant 63 year old postmenopausal female patient with newly diagnosed right breast DCIS, ER/PR positive s/p surgery and adj radiation , now on adj tamoxifen  who is here for follow up. 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  Decision to switch to exemestane  to avoid interactions and potentially alleviate memory issues. - Discontinue tamoxifen . - Initiate exemestane  (Aromasin ). - Monitor for side effects such as joint pain, hot flashes, and dryness and bone density loss. - Evaluate response to exemestane  over a couple of months.  Anxiety disorder Anxiety contributes to memory issues and is compounded by family stressors. Current treatment with Lexapro is at the maximum dose and is no longer effective. The switch to exemestane  allows for the use of Prozac without interaction concerns. - Monitor anxiety symptoms and adjust treatment as needed.  Cognitive complaints (memory loss) Memory issues potentially exacerbated by tamoxifen  and anxiety. No formal diagnosis of mild cognitive impairment. Plan to switch to exemestane  aims to assess if tamoxifen  is contributing to memory issues. - Address anxiety as a contributing factor to memory issues.  Osteopenia and risk of bone density loss related to endocrine therapy Exemestane  can negatively impact bone density. She is aware of this risk and is willing to proceed with the switch from tamoxifen  to exemestane . - Continue weight bearing exercises, ca and vit D supplementation.  Follow-Up Follow-up plans discussed to monitor response to new medication regimen and overall health status. - Call in four weeks to assess response to exemestane . - Schedule follow-up appointment in April

## 2023-09-23 NOTE — Progress Notes (Signed)
 [facility] CONSULT NOTE  Patient Care Team: Nanci Senior, MD as PCP - General (Family Medicine) April Shoulders, MD as Consulting Physician (General Surgery) April Ash, MD as Consulting Physician (Hematology and Oncology) April Rush, MD as Consulting Physician (Radiation Oncology)  CHIEF COMPLAINTS/PURPOSE OF CONSULTATION:  DCIS  ASSESSMENT & PLAN:  Ductal carcinoma in situ (DCIS) of right breast This is a very pleasant 63 year old postmenopausal female patient with newly diagnosed right breast DCIS, ER/PR positive s/p surgery and adj radiation , now on adj tamoxifen  who is here for follow up. 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  Decision to switch to exemestane  to avoid interactions and potentially alleviate memory issues. - Discontinue tamoxifen . - Initiate exemestane  (Aromasin ). - Monitor for side effects such as joint pain, hot flashes, and dryness and bone density loss. - Evaluate response to exemestane  over a couple of months.  Anxiety disorder Anxiety contributes to memory issues and is compounded by family stressors. Current treatment with Lexapro is at the maximum dose and is no longer effective. The switch to exemestane  allows for the use of Prozac without interaction concerns. - Monitor anxiety symptoms and adjust treatment as needed.  Cognitive complaints (memory loss) Memory issues potentially exacerbated by tamoxifen  and anxiety. No formal diagnosis of mild cognitive impairment. Plan to switch to exemestane  aims to assess if tamoxifen  is contributing to memory issues. - Address anxiety as a contributing factor to memory issues.  Osteopenia and risk of bone density loss related to endocrine therapy Exemestane  can negatively impact bone density. She is aware of this risk and is willing to proceed with the switch from tamoxifen   to exemestane . - Continue weight bearing exercises, ca and vit D supplementation.  Follow-Up Follow-up plans discussed to monitor response to new medication regimen and overall health status. - Call in four weeks to assess response to exemestane . - Schedule follow-up appointment in April      No orders of the defined types were placed in this encounter.    HISTORY OF PRESENTING ILLNESS:  April Maynard 63 y.o. female is here because of DCIS right breast.  Oncology History  Ductal carcinoma in situ (DCIS) of right breast  02/10/2022 Mammogram   Stable probably benign right breast calcs noted in Jan 2024. 6 month follow up mammogram showed increasing indeterminate calcs right breast.   08/27/2022 Pathology Results   Right breast needle core biopsy showed DCIS, solid type, intermediate to high nuclear grade, necrosis present, calcifications present, prognostic showed ER 100% positive strong staining PR 2% positive strong staining   09/28/2022 Cancer Staging   Staging form: Breast, AJCC 8th Edition - Clinical: Stage 0 (cTis (DCIS), cN0, cM0, G2, ER+, PR+) - Signed by April Ash, MD on 09/28/2022 Stage prefix: Initial diagnosis Histologic grading system: 3 grade system    Genetic Testing   [facility] CancerNext Panel+RNA was Negative. Report date is 10/16/2022.   The CancerNext gene panel offered by [employer] includes sequencing, rearrangement analysis, and RNA analysis for the following 34 genes:   APC, ATM, AXIN2, BARD1, BMPR1A, BRCA1, BRCA2, BRIP1, CDH1, CDK4, CDKN2A, CHEK2, DICER1, HOXB13, EPCAM, GREM1, MLH1, MSH2, MSH3, MSH6, MUTYH, NF1, NTHL1, PALB2, PMS2, POLD1, POLE, PTEN, RAD51C, RAD51D, SMAD4, SMARCA4, STK11, and TP53.    09/30/2022 Surgery   Right Breast Lumpectomy: DCIS, 1.2cm    11/29/2022 - 12/24/2022 Radiation Therapy   Plan Name: Breast_R Site: Breast, Right Technique: 3D Mode: Photon  Dose Per Fraction: 2.66 Gy Prescribed Dose (Delivered / Prescribed): 42.56  Gy / 42.56 Gy Prescribed Fxs (Delivered / Prescribed): 16 / 16   Plan Name: Breast_R_Bst Site: Breast, Right Technique: 3D Mode: Photon Dose Per Fraction: 2 Gy Prescribed Dose (Delivered / Prescribed): 8 Gy / 8 Gy Prescribed Fxs (Delivered / Prescribed): 4 / 4   01/2023 -  Anti-estrogen oral therapy   Tamoxifen  x 5 years     Age at first child birth: 89 Age at first cycle: 12/13 No use of HRT OCP use for 4 yrs FH of breast cancer, sister had BC at 14. History of breast feeding:  No Alcohol : occasions. Retired Runner, broadcasting/film/video,   Interval History  Discussed the use of AI scribe software for clinical note transcription with the patient, who gave verbal consent to proceed.  History of Present Illness KEARSTIN LEARN is a 63 year old female with breast cancer on tamoxifen  who presents with concerns about memory issues and anxiety.  She has been experiencing ongoing memory issues, which she believes have been exacerbated by tamoxifen . She had some memory issues prior to starting tamoxifen , largely attributed to anxiety. She is taking tamoxifen  at a dose of 5 mg once daily. Her husband notes that her memory issues often occur when she is overwhelmed with multiple concerns. She has not been diagnosed with mild cognitive impairment but has taken a cognitive test in the past.  She is currently taking Lexapro at the highest dose for anxiety. There is concern about potential interactions with tamoxifen  if switched to Prozac. Her anxiety is compounded by family stressors, particularly involving her daughter, who frequently shares her problems, adding to her anxiety. She has been trying to focus on herself and has reduced communication with her daughter to manage her stress better.  She is concerned about her bone density. She has a history of a total hysterectomy and menopause, which may influence her symptoms. No significant hot flashes or dryness.   MEDICAL HISTORY:  Past Medical History:   Diagnosis Date   Alternating exotropia    Anxiety    Bilateral eye strain    Breast cancer (HCC) 08/27/2022   Depression    Dissociated vertical deviation    Family history of breast cancer    Hypertension     SURGICAL HISTORY: Past Surgical History:  Procedure Laterality Date   BREAST BIOPSY Right 08/27/2022   MM RT BREAST BX W LOC DEV 1ST LESION IMAGE BX SPEC STEREO GUIDE 08/27/2022 [facility]   BREAST BIOPSY  09/29/2022   MM RT RADIOACTIVE SEED LOC MAMMO GUIDE 09/29/2022 [facility]   BREAST LUMPECTOMY WITH RADIOACTIVE SEED LOCALIZATION Right 09/30/2022   Procedure: RIGHT BREAST LUMPECTOMY WITH RADIOACTIVE SEED LOCALIZATION;  Surgeon: April Shoulders, MD;  Location: [facility];  Service: General;  Laterality: Right;   STRABISMUS SURGERY Bilateral 11/17/2015   SUPRACERVICAL ABDOMINAL HYSTERECTOMY      SOCIAL HISTORY: Social History   Socioeconomic History   Marital status: Married    Spouse name: Not on file   Number of children: Not on file   Years of education: Not on file   Highest education level: Not on file  Occupational History   Not on file  Tobacco Use   Smoking status: Never   Smokeless tobacco: Never  Vaping Use   Vaping status: Never Used  Substance and Sexual Activity   Alcohol use: Yes    Comment: occ   Drug use: Never   Sexual activity:  Not on file  Other Topics Concern   Not on file  Social History Narrative   Not on file   Social Drivers of Health   Financial Resource Strain: Not on file  Food Insecurity: No Food Insecurity (09/09/2022)   Hunger Vital Sign    Worried About Running Out of Food in the Last Year: Never true    Ran Out of Food in the Last Year: Never true  Transportation Needs: No Transportation Needs (09/09/2022)   PRAPARE - Administrator, Civil Service (Medical): No    Lack of Transportation (Non-Medical): No  Physical Activity: Not on file  Stress: Not on file  Social Connections: Not  on file  Intimate Partner Violence: Not At Risk (09/09/2022)   Humiliation, Afraid, Rape, and Kick questionnaire    Fear of Current or Ex-Partner: No    Emotionally Abused: No    Physically Abused: No    Sexually Abused: No    FAMILY HISTORY: Family History  Problem Relation Age of Onset   Breast cancer Sister 75   BRCA 1/2 Neg Hx     ALLERGIES:  is allergic to morphine.  MEDICATIONS:  Current Outpatient Medications  Medication Sig Dispense Refill   ALPRAZolam (XANAX) 0.25 MG tablet Take 0.25 mg by mouth 2 (two) times daily as needed.     ascorbic acid (VITAMIN C) 1000 MG tablet Take by mouth.     calcium carbonate (TUMS EX) 750 MG chewable tablet Chew 1 tablet by mouth daily.     Cholecalciferol (VITAMIN D-1000 MAX ST) 25 MCG (1000 UT) tablet Take by mouth.     escitalopram (LEXAPRO) 20 MG tablet Take 20 mg by mouth daily.     exemestane  (AROMASIN ) 25 MG tablet Take 1 tablet (25 mg total) by mouth daily after breakfast. 30 tablet 1   fluticasone (FLONASE) 50 MCG/ACT nasal spray Use 2 squirts to each nostril 2 times daily for 7-10 days, then 2 squirts in each nare daily     losartan (COZAAR) 25 MG tablet Take by mouth.     oxyCODONE  (OXY IR/ROXICODONE ) 5 MG immediate release tablet Take 1 tablet (5 mg total) by mouth every 6 (six) hours as needed for severe pain. 8 tablet 0   traZODone (DESYREL) 50 MG tablet Take 50-100 mg by mouth at bedtime as needed.     Zinc 10 MG LOZG Zinc     No current facility-administered medications for this visit.     PHYSICAL EXAMINATION: ECOG PERFORMANCE STATUS: 0 - Asymptomatic  Vitals:   09/23/23 0856  BP: 121/62  Pulse: 72  Resp: 17  Temp: 98.3 F (36.8 C)  SpO2: 100%    Filed Weights   09/23/23 0856  Weight: 160 lb 9.6 oz (72.8 kg)     GENERAL:alert, no distress and comfortable Bilateral breasts inspected and palpated. No palpable masses. No regional adenopathy.  LABORATORY DATA:  I have reviewed the data as listed Lab  Results  Component Value Date   WBC 7.1 09/28/2022   HGB 13.9 09/28/2022   HCT 42.3 09/28/2022   MCV 89.6 09/28/2022   PLT 248 09/28/2022     Chemistry      Component Value Date/Time   NA 137 09/28/2022 1153   K 4.1 09/28/2022 1153   CL 106 09/28/2022 1153   CO2 24 09/28/2022 1153   BUN 21 09/28/2022 1153   CREATININE 0.93 09/28/2022 1153      Component Value Date/Time   CALCIUM 9.4 09/28/2022  1153   ALKPHOS 55 09/28/2022 1153   AST 22 09/28/2022 1153   ALT 12 09/28/2022 1153   BILITOT 0.5 09/28/2022 1153       RADIOGRAPHIC STUDIES: I have personally reviewed the radiological images as listed and agreed with the findings in the report. MM 3D DIAGNOSTIC MAMMOGRAM BILATERAL BREAST Result Date: 08/25/2023 CLINICAL DATA:  62 year old with lung malignant lumpectomy of the upper RIGHT breast in August, 2024, pathology DCIS. Adjuvant radiation therapy. This is the patient's initial post lumpectomy annual evaluation. EXAM: DIGITAL DIAGNOSTIC BILATERAL MAMMOGRAM WITH TOMOSYNTHESIS AND CAD TECHNIQUE: Bilateral digital diagnostic mammography and breast tomosynthesis was performed. The images were evaluated with computer-aided detection. COMPARISON:  Previous exam(s). ACR Breast Density Category c: The breasts are heterogeneously dense, which may obscure small masses. FINDINGS: Full field CC and MLO views of both breasts and a spot magnification MLO view of the lumpectomy site in the RIGHT breast were obtained. RIGHT: No findings suspicious for malignancy. Scar/distortion at the lumpectomy site in the upper breast at posterior depth. Residual mild post radiation trabecular thickening diffusely. LEFT: No findings suspicious for malignancy. IMPRESSION: 1. No mammographic evidence of malignancy involving either breast. 2. Expected post lumpectomy changes and residual mild post radiation changes involving the RIGHT breast. RECOMMENDATION: Annual BILATERAL diagnostic mammography in 1 year. I have  discussed the findings and recommendations with the patient. If applicable, a reminder letter will be sent to the patient regarding the next appointment. BI-RADS CATEGORY  2: Benign. Electronically Signed   By: Debby Satterfield M.D.   On: 08/25/2023 11:55    All questions were answered. The patient knows to call the clinic with any problems, questions or concerns. I spent 30 minutes in the care of this patient including H and P, review of records, counseling and coordination of care.     Amber Stalls, MD 09/23/2023 11:29 AM

## 2023-10-16 ENCOUNTER — Other Ambulatory Visit: Payer: Self-pay | Admitting: Hematology and Oncology

## 2023-10-21 ENCOUNTER — Inpatient Hospital Stay: Attending: Hematology and Oncology | Admitting: Hematology and Oncology

## 2023-10-21 DIAGNOSIS — D0511 Intraductal carcinoma in situ of right breast: Secondary | ICD-10-CM

## 2023-10-21 MED ORDER — EXEMESTANE 25 MG PO TABS: 25.0000 mg | ORAL_TABLET | Freq: Every day | ORAL | 3 refills | Status: AC

## 2023-10-21 NOTE — Progress Notes (Signed)
 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  Decision to switch to exemestane  to avoid interactions and potentially alleviate memory issues. Started on exemestane , mid August. This is a follow up telephone visit to see how she is doing. She says she has been doing well. She is tolerating exemestane  well and would like to have a 90 day supply. Refill sent to [facility].   Osteopenia and risk of bone density loss related to endocrine therapy Exemestane  can negatively impact bone density.  - Continue weight bearing exercises, ca and vit D supplementation.   Anxiety Better controlled on prozac   HISTORY OF PRESENTING ILLNESS:  April Maynard 63 y.o. female is here because of DCIS right breast.  Oncology History  Ductal carcinoma in situ (DCIS) of right breast  02/10/2022 Mammogram   Stable probably benign right breast calcs noted in Jan 2024. 6 month follow up mammogram showed increasing indeterminate calcs right breast.   08/27/2022 Pathology Results   Right breast needle core biopsy showed DCIS, solid type, intermediate to high nuclear grade, necrosis present, calcifications present, prognostic showed ER 100% positive strong staining PR 2% positive strong staining   09/28/2022 Cancer Staging   Staging form: Breast, AJCC 8th Edition - Clinical: Stage 0 (cTis (DCIS), cN0, cM0, G2, ER+, PR+) - Signed by Loretha Ash, MD on 09/28/2022 Stage prefix: Initial diagnosis Histologic grading system: 3 grade system    Genetic Testing    [facility] CancerNext Panel+RNA was Negative. Report date is 10/16/2022.   The CancerNext gene panel offered by [employer] includes sequencing, rearrangement analysis, and RNA analysis for the following 34 genes:   APC, ATM, AXIN2, BARD1, BMPR1A, BRCA1, BRCA2, BRIP1, CDH1, CDK4, CDKN2A, CHEK2, DICER1, HOXB13, EPCAM, GREM1, MLH1, MSH2, MSH3, MSH6, MUTYH, NF1, NTHL1, PALB2, PMS2, POLD1, POLE, PTEN, RAD51C, RAD51D, SMAD4, SMARCA4, STK11, and TP53.    09/30/2022 Surgery   Right Breast Lumpectomy: DCIS, 1.2cm    11/29/2022 - 12/24/2022 Radiation Therapy   Plan Name: Breast_R Site: Breast, Right Technique: 3D Mode: Photon Dose Per Fraction: 2.66 Gy Prescribed Dose (Delivered / Prescribed): 42.56 Gy / 42.56 Gy Prescribed Fxs (Delivered / Prescribed): 16 / 16   Plan Name: Breast_R_Bst Site: Breast, Right Technique: 3D Mode: Photon Dose Per Fraction: 2 Gy Prescribed Dose (Delivered / Prescribed): 8 Gy / 8 Gy Prescribed Fxs (Delivered / Prescribed): 4 / 4   01/2023 -  Anti-estrogen oral therapy   Tamoxifen  x 5 years     Age at first child birth: 61 Age at first cycle: 12/13 No use of HRT OCP use for 4 yrs FH of breast cancer, sister had BC at 56. History of breast feeding:  No Alcohol : occasions. Retired Runner, broadcasting/film/video,   Interval History  Discussed the use of AI scribe software for clinical note transcription with the patient, who gave verbal consent to proceed.  History of Present Illness April Maynard is a 63 year old female with breast cancer initially tried on tamoxifen  now on exemestane  here for a follow up.  Discussed the use of AI scribe software for clinical note transcription with the patient, who gave verbal consent  to proceed.  History of Present Illness    MEDICAL HISTORY:  Past Medical History:  Diagnosis Date   Alternating exotropia    Anxiety    Bilateral eye strain    Breast cancer (HCC) 08/27/2022   Depression    Dissociated vertical deviation    Family  history of breast cancer    Hypertension     SURGICAL HISTORY: Past Surgical History:  Procedure Laterality Date   BREAST BIOPSY Right 08/27/2022   MM RT BREAST BX W LOC DEV 1ST LESION IMAGE BX SPEC STEREO GUIDE 08/27/2022 [facility]   BREAST BIOPSY  09/29/2022   MM RT RADIOACTIVE SEED LOC MAMMO GUIDE 09/29/2022 [facility]   BREAST LUMPECTOMY WITH RADIOACTIVE SEED LOCALIZATION Right 09/30/2022   Procedure: RIGHT BREAST LUMPECTOMY WITH RADIOACTIVE SEED LOCALIZATION;  Surgeon: Aron Shoulders, MD;  Location: [facility];  Service: General;  Laterality: Right;   STRABISMUS SURGERY Bilateral 11/17/2015   SUPRACERVICAL ABDOMINAL HYSTERECTOMY      SOCIAL HISTORY: Social History   Socioeconomic History   Marital status: Married    Spouse name: Not on file   Number of children: Not on file   Years of education: Not on file   Highest education level: Not on file  Occupational History   Not on file  Tobacco Use   Smoking status: Never   Smokeless tobacco: Never  Vaping Use   Vaping status: Never Used  Substance and Sexual Activity   Alcohol use: Yes    Comment: occ   Drug use: Never   Sexual activity: Not on file  Other Topics Concern   Not on file  Social History Narrative   Not on file   Social Drivers of Health   Financial Resource Strain: Not on file  Food Insecurity: No Food Insecurity (09/09/2022)   Hunger Vital Sign    Worried About Running Out of Food in the Last Year: Never true    Ran Out of Food in the Last Year: Never true  Transportation Needs: No Transportation Needs (09/09/2022)   PRAPARE - Administrator, Civil Service (Medical): No    Lack of Transportation (Non-Medical): No  Physical Activity: Not on file  Stress: Not on file  Social Connections: Not on file  Intimate Partner Violence: Not At Risk (09/09/2022)   Humiliation, Afraid, Rape, and Kick questionnaire    Fear of Current or Ex-Partner: No    Emotionally Abused:  No    Physically Abused: No    Sexually Abused: No    FAMILY HISTORY: Family History  Problem Relation Age of Onset   Breast cancer Sister 18   BRCA 1/2 Neg Hx     ALLERGIES:  is allergic to morphine.  MEDICATIONS:  Current Outpatient Medications  Medication Sig Dispense Refill   ALPRAZolam (XANAX) 0.25 MG tablet Take 0.25 mg by mouth 2 (two) times daily as needed.     ascorbic acid (VITAMIN C) 1000 MG tablet Take by mouth.     calcium carbonate (TUMS EX) 750 MG chewable tablet Chew 1 tablet by mouth daily.     Cholecalciferol (VITAMIN D-1000 MAX ST) 25 MCG (1000 UT) tablet Take by mouth.     escitalopram (LEXAPRO) 20 MG tablet Take 20 mg by mouth daily.     exemestane  (AROMASIN ) 25 MG tablet Take 1 tablet (25 mg total) by mouth daily after breakfast. 30 tablet 1   fluticasone (FLONASE) 50 MCG/ACT nasal spray Use 2 squirts to each nostril 2  times daily for 7-10 days, then 2 squirts in each nare daily     losartan (COZAAR) 25 MG tablet Take by mouth.     oxyCODONE  (OXY IR/ROXICODONE ) 5 MG immediate release tablet Take 1 tablet (5 mg total) by mouth every 6 (six) hours as needed for severe pain. 8 tablet 0   traZODone (DESYREL) 50 MG tablet Take 50-100 mg by mouth at bedtime as needed.     Zinc 10 MG LOZG Zinc     No current facility-administered medications for this visit.     PHYSICAL EXAMINATION: ECOG PERFORMANCE STATUS: 0 - Asymptomatic  Telephone visit.  LABORATORY DATA:  I have reviewed the data as listed Lab Results  Component Value Date   WBC 7.1 09/28/2022   HGB 13.9 09/28/2022   HCT 42.3 09/28/2022   MCV 89.6 09/28/2022   PLT 248 09/28/2022     Chemistry      Component Value Date/Time   NA 137 09/28/2022 1153   K 4.1 09/28/2022 1153   CL 106 09/28/2022 1153   CO2 24 09/28/2022 1153   BUN 21 09/28/2022 1153   CREATININE 0.93 09/28/2022 1153      Component Value Date/Time   CALCIUM 9.4 09/28/2022 1153   ALKPHOS 55 09/28/2022 1153   AST 22 09/28/2022  1153   ALT 12 09/28/2022 1153   BILITOT 0.5 09/28/2022 1153       RADIOGRAPHIC STUDIES: I have personally reviewed the radiological images as listed and agreed with the findings in the report. No results found.   All questions were answered. The patient knows to call the clinic with any problems, questions or concerns. I spent 10 minutes in the care of this patient including H and P, review of records, counseling and coordination of care.  I connected with  Carmaleta H Gueye on 10/21/23 by a telephone application and verified that I am speaking with the correct person using two identifiers.   I discussed the limitations of evaluation and management by telemedicine. The patient expressed understanding and agreed to proceed.;e   Location of pt: Home Location of provider: Office.   Amber Stalls, MD 10/21/2023 9:10 AM

## 2023-10-25 ENCOUNTER — Ambulatory Visit: Admitting: Hematology and Oncology

## 2024-01-17 ENCOUNTER — Telehealth: Payer: Self-pay | Admitting: Hematology and Oncology

## 2024-01-17 NOTE — Telephone Encounter (Signed)
 Left the patient a voicemail regarding 05/07/24 appt being rescheduled to 05/08/24.

## 2024-04-20 ENCOUNTER — Inpatient Hospital Stay: Admitting: Hematology and Oncology

## 2024-05-07 ENCOUNTER — Ambulatory Visit: Admitting: Hematology and Oncology

## 2024-05-08 ENCOUNTER — Inpatient Hospital Stay: Admitting: Hematology and Oncology
